# Patient Record
Sex: Male | Born: 1945 | Smoking: Former smoker
Health system: Southern US, Community
[De-identification: ages and names within clinical notes are randomized; demographics above are authoritative.]

## PROBLEM LIST (undated history)

## (undated) DIAGNOSIS — E669 Obesity, unspecified: Secondary | ICD-10-CM

## (undated) DIAGNOSIS — I10 Essential (primary) hypertension: Secondary | ICD-10-CM

## (undated) DIAGNOSIS — K219 Gastro-esophageal reflux disease without esophagitis: Secondary | ICD-10-CM

## (undated) DIAGNOSIS — E1169 Type 2 diabetes mellitus with other specified complication: Secondary | ICD-10-CM

## (undated) DIAGNOSIS — G629 Polyneuropathy, unspecified: Secondary | ICD-10-CM

## (undated) DIAGNOSIS — I2510 Atherosclerotic heart disease of native coronary artery without angina pectoris: Secondary | ICD-10-CM

## (undated) DIAGNOSIS — N186 End stage renal disease: Secondary | ICD-10-CM

## (undated) HISTORY — PX: ANGIOPLASTY: SHX39

---

## 2019-01-06 ENCOUNTER — Inpatient Hospital Stay (HOSPITAL_COMMUNITY): Payer: Medicare Other

## 2019-01-06 ENCOUNTER — Inpatient Hospital Stay (HOSPITAL_COMMUNITY)
Admission: EM | Admit: 2019-01-06 | Discharge: 2019-01-11 | DRG: 441 | Disposition: A | Discharge status: Home Health | Payer: Medicare Other | Source: Other Acute Inpatient Hospital | Attending: Family Medicine | Admitting: Family Medicine

## 2019-01-06 ENCOUNTER — Encounter (HOSPITAL_COMMUNITY): Payer: Self-pay | Admitting: Pulmonary Disease

## 2019-01-06 DIAGNOSIS — E1143 Type 2 diabetes mellitus with diabetic autonomic (poly)neuropathy: Secondary | ICD-10-CM | POA: Diagnosis present

## 2019-01-06 DIAGNOSIS — N186 End stage renal disease: Secondary | ICD-10-CM | POA: Diagnosis present

## 2019-01-06 DIAGNOSIS — D631 Anemia in chronic kidney disease: Secondary | ICD-10-CM | POA: Diagnosis present

## 2019-01-06 DIAGNOSIS — K7200 Acute and subacute hepatic failure without coma: Principal | ICD-10-CM

## 2019-01-06 DIAGNOSIS — I4510 Unspecified right bundle-branch block: Secondary | ICD-10-CM | POA: Diagnosis present

## 2019-01-06 DIAGNOSIS — D689 Coagulation defect, unspecified: Secondary | ICD-10-CM | POA: Diagnosis not present

## 2019-01-06 DIAGNOSIS — I132 Hypertensive heart and chronic kidney disease with heart failure and with stage 5 chronic kidney disease, or end stage renal disease: Secondary | ICD-10-CM | POA: Diagnosis present

## 2019-01-06 DIAGNOSIS — G4733 Obstructive sleep apnea (adult) (pediatric): Secondary | ICD-10-CM | POA: Diagnosis present

## 2019-01-06 DIAGNOSIS — K3184 Gastroparesis: Secondary | ICD-10-CM | POA: Diagnosis present

## 2019-01-06 DIAGNOSIS — E875 Hyperkalemia: Secondary | ICD-10-CM | POA: Diagnosis present

## 2019-01-06 DIAGNOSIS — J449 Chronic obstructive pulmonary disease, unspecified: Secondary | ICD-10-CM | POA: Diagnosis present

## 2019-01-06 DIAGNOSIS — K7290 Hepatic failure, unspecified without coma: Secondary | ICD-10-CM

## 2019-01-06 DIAGNOSIS — R630 Anorexia: Secondary | ICD-10-CM | POA: Diagnosis present

## 2019-01-06 DIAGNOSIS — I5022 Chronic systolic (congestive) heart failure: Secondary | ICD-10-CM | POA: Diagnosis present

## 2019-01-06 DIAGNOSIS — I2510 Atherosclerotic heart disease of native coronary artery without angina pectoris: Secondary | ICD-10-CM | POA: Diagnosis present

## 2019-01-06 DIAGNOSIS — E1151 Type 2 diabetes mellitus with diabetic peripheral angiopathy without gangrene: Secondary | ICD-10-CM | POA: Diagnosis present

## 2019-01-06 DIAGNOSIS — Z79891 Long term (current) use of opiate analgesic: Secondary | ICD-10-CM

## 2019-01-06 DIAGNOSIS — N2581 Secondary hyperparathyroidism of renal origin: Secondary | ICD-10-CM | POA: Diagnosis present

## 2019-01-06 DIAGNOSIS — D696 Thrombocytopenia, unspecified: Secondary | ICD-10-CM | POA: Diagnosis not present

## 2019-01-06 DIAGNOSIS — E1122 Type 2 diabetes mellitus with diabetic chronic kidney disease: Secondary | ICD-10-CM | POA: Diagnosis present

## 2019-01-06 DIAGNOSIS — Z9981 Dependence on supplemental oxygen: Secondary | ICD-10-CM | POA: Diagnosis not present

## 2019-01-06 DIAGNOSIS — D6959 Other secondary thrombocytopenia: Secondary | ICD-10-CM | POA: Diagnosis present

## 2019-01-06 DIAGNOSIS — R579 Shock, unspecified: Secondary | ICD-10-CM | POA: Diagnosis not present

## 2019-01-06 DIAGNOSIS — R578 Other shock: Secondary | ICD-10-CM | POA: Diagnosis present

## 2019-01-06 DIAGNOSIS — Z86010 Personal history of colonic polyps: Secondary | ICD-10-CM

## 2019-01-06 DIAGNOSIS — Z6829 Body mass index (BMI) 29.0-29.9, adult: Secondary | ICD-10-CM

## 2019-01-06 DIAGNOSIS — E785 Hyperlipidemia, unspecified: Secondary | ICD-10-CM | POA: Diagnosis present

## 2019-01-06 DIAGNOSIS — I272 Pulmonary hypertension, unspecified: Secondary | ICD-10-CM | POA: Diagnosis present

## 2019-01-06 DIAGNOSIS — Z992 Dependence on renal dialysis: Secondary | ICD-10-CM | POA: Diagnosis not present

## 2019-01-06 DIAGNOSIS — Z86718 Personal history of other venous thrombosis and embolism: Secondary | ICD-10-CM

## 2019-01-06 DIAGNOSIS — R945 Abnormal results of liver function studies: Secondary | ICD-10-CM | POA: Diagnosis present

## 2019-01-06 DIAGNOSIS — R570 Cardiogenic shock: Secondary | ICD-10-CM | POA: Diagnosis not present

## 2019-01-06 DIAGNOSIS — Z9049 Acquired absence of other specified parts of digestive tract: Secondary | ICD-10-CM

## 2019-01-06 DIAGNOSIS — D684 Acquired coagulation factor deficiency: Secondary | ICD-10-CM | POA: Diagnosis present

## 2019-01-06 DIAGNOSIS — I21A1 Myocardial infarction type 2: Secondary | ICD-10-CM | POA: Diagnosis present

## 2019-01-06 DIAGNOSIS — Z7901 Long term (current) use of anticoagulants: Secondary | ICD-10-CM

## 2019-01-06 DIAGNOSIS — I429 Cardiomyopathy, unspecified: Secondary | ICD-10-CM | POA: Diagnosis present

## 2019-01-06 DIAGNOSIS — I440 Atrioventricular block, first degree: Secondary | ICD-10-CM | POA: Diagnosis present

## 2019-01-06 DIAGNOSIS — K759 Inflammatory liver disease, unspecified: Secondary | ICD-10-CM

## 2019-01-06 DIAGNOSIS — R001 Bradycardia, unspecified: Secondary | ICD-10-CM | POA: Diagnosis present

## 2019-01-06 DIAGNOSIS — I080 Rheumatic disorders of both mitral and aortic valves: Secondary | ICD-10-CM | POA: Diagnosis present

## 2019-01-06 DIAGNOSIS — B179 Acute viral hepatitis, unspecified: Secondary | ICD-10-CM | POA: Diagnosis present

## 2019-01-06 DIAGNOSIS — Z87891 Personal history of nicotine dependence: Secondary | ICD-10-CM

## 2019-01-06 DIAGNOSIS — Z9682 Presence of neurostimulator: Secondary | ICD-10-CM

## 2019-01-06 DIAGNOSIS — Z9862 Peripheral vascular angioplasty status: Secondary | ICD-10-CM

## 2019-01-06 DIAGNOSIS — E119 Type 2 diabetes mellitus without complications: Secondary | ICD-10-CM | POA: Diagnosis not present

## 2019-01-06 DIAGNOSIS — K219 Gastro-esophageal reflux disease without esophagitis: Secondary | ICD-10-CM | POA: Diagnosis present

## 2019-01-06 DIAGNOSIS — R7989 Other specified abnormal findings of blood chemistry: Secondary | ICD-10-CM | POA: Diagnosis present

## 2019-01-06 HISTORY — DX: Polyneuropathy, unspecified: G62.9

## 2019-01-06 HISTORY — DX: Atherosclerotic heart disease of native coronary artery without angina pectoris: I25.10

## 2019-01-06 HISTORY — DX: End stage renal disease: N18.6

## 2019-01-06 HISTORY — DX: Gastro-esophageal reflux disease without esophagitis: K21.9

## 2019-01-06 HISTORY — DX: Type 2 diabetes mellitus with other specified complication: E11.69

## 2019-01-06 HISTORY — DX: Type 2 diabetes mellitus with other specified complication: E66.9

## 2019-01-06 HISTORY — DX: Essential (primary) hypertension: I10

## 2019-01-06 LAB — CBC WITH DIFFERENTIAL/PLATELET
Abs Immature Granulocytes: 0.1 10*3/uL — ABNORMAL HIGH (ref 0.00–0.07)
Basophils Absolute: 0 10*3/uL (ref 0.0–0.1)
Basophils Relative: 0 %
Eosinophils Absolute: 0 10*3/uL (ref 0.0–0.5)
Eosinophils Relative: 0 %
HCT: 25 % — ABNORMAL LOW (ref 39.0–52.0)
Hemoglobin: 8.2 g/dL — ABNORMAL LOW (ref 13.0–17.0)
Immature Granulocytes: 1 %
Lymphocytes Relative: 5 %
Lymphs Abs: 0.8 10*3/uL (ref 0.7–4.0)
MCH: 28.6 pg (ref 26.0–34.0)
MCHC: 32.8 g/dL (ref 30.0–36.0)
MCV: 87.1 fL (ref 80.0–100.0)
Monocytes Absolute: 1.1 10*3/uL — ABNORMAL HIGH (ref 0.1–1.0)
Monocytes Relative: 7 %
Neutro Abs: 12.8 10*3/uL — ABNORMAL HIGH (ref 1.7–7.7)
Neutrophils Relative %: 87 %
Platelets: 167 10*3/uL (ref 150–400)
RBC: 2.87 MIL/uL — ABNORMAL LOW (ref 4.22–5.81)
RDW: 14.4 % (ref 11.5–15.5)
WBC: 14.8 10*3/uL — ABNORMAL HIGH (ref 4.0–10.5)
nRBC: 0.1 % (ref 0.0–0.2)

## 2019-01-06 LAB — COOXEMETRY PANEL
Carboxyhemoglobin: 1.1 % (ref 0.5–1.5)
Methemoglobin: 1.4 % (ref 0.0–1.5)
O2 Saturation: 69.8 %
Total hemoglobin: 8.4 g/dL — ABNORMAL LOW (ref 12.0–16.0)

## 2019-01-06 LAB — GLUCOSE, CAPILLARY
Glucose-Capillary: 160 mg/dL — ABNORMAL HIGH (ref 70–99)
Glucose-Capillary: 150 mg/dL — ABNORMAL HIGH (ref 70–99)
Glucose-Capillary: 197 mg/dL — ABNORMAL HIGH (ref 70–99)

## 2019-01-06 LAB — MAGNESIUM: Magnesium: 2.1 mg/dL (ref 1.7–2.4)

## 2019-01-06 LAB — COMPREHENSIVE METABOLIC PANEL
ALT: 2081 U/L — ABNORMAL HIGH (ref 0–44)
AST: 4014 U/L — ABNORMAL HIGH (ref 15–41)
Albumin: 2.6 g/dL — ABNORMAL LOW (ref 3.5–5.0)
Alkaline Phosphatase: 134 U/L — ABNORMAL HIGH (ref 38–126)
Anion gap: 22 — ABNORMAL HIGH (ref 5–15)
BUN: 59 mg/dL — ABNORMAL HIGH (ref 8–23)
CO2: 17 mmol/L — ABNORMAL LOW (ref 22–32)
Calcium: 7.9 mg/dL — ABNORMAL LOW (ref 8.9–10.3)
Chloride: 95 mmol/L — ABNORMAL LOW (ref 98–111)
Creatinine, Ser: 10.04 mg/dL — ABNORMAL HIGH (ref 0.61–1.24)
GFR calc Af Amer: 5 mL/min — ABNORMAL LOW (ref >60)
GFR calc non Af Amer: 5 mL/min — ABNORMAL LOW (ref >60)
Glucose, Bld: 150 mg/dL — ABNORMAL HIGH (ref 70–99)
Potassium: 6.6 mmol/L (ref 3.5–5.1)
Sodium: 134 mmol/L — ABNORMAL LOW (ref 135–145)
Total Bilirubin: 1.6 mg/dL — ABNORMAL HIGH (ref 0.3–1.2)
Total Protein: 5.9 g/dL — ABNORMAL LOW (ref 6.5–8.1)

## 2019-01-06 LAB — BRAIN NATRIURETIC PEPTIDE: B Natriuretic Peptide: 3496.8 pg/mL — ABNORMAL HIGH (ref 0.0–100.0)

## 2019-01-06 LAB — TYPE AND SCREEN
ABO/RH(D): A POS
Antibody Screen: NEGATIVE

## 2019-01-06 LAB — APTT: aPTT: 45 seconds — ABNORMAL HIGH (ref 24–36)

## 2019-01-06 LAB — PROTIME-INR
INR: 6.3 (ref 0.8–1.2)
Prothrombin Time: 54.7 seconds — ABNORMAL HIGH (ref 11.4–15.2)

## 2019-01-06 LAB — PHOSPHORUS: Phosphorus: 9.6 mg/dL — ABNORMAL HIGH (ref 2.5–4.6)

## 2019-01-06 LAB — LACTIC ACID, PLASMA: Lactic Acid, Venous: 6.6 mmol/L (ref 0.5–1.9)

## 2019-01-06 LAB — PROCALCITONIN: Procalcitonin: 7.39 ng/mL

## 2019-01-06 LAB — MRSA PCR SCREENING: MRSA by PCR: NEGATIVE

## 2019-01-06 LAB — ABO/RH: ABO/RH(D): A POS

## 2019-01-06 LAB — TROPONIN I: Troponin I: 0.26 ng/mL (ref <0.03)

## 2019-01-06 MED ORDER — SODIUM CHLORIDE 0.9 % IV SOLN
2.0000 g | Freq: Once | INTRAVENOUS | Status: AC
  Administered 2019-01-06: 23:00:00 2 g via INTRAVENOUS
  Filled 2019-01-06: qty 2

## 2019-01-06 MED ORDER — IPRATROPIUM-ALBUTEROL 0.5-2.5 (3) MG/3ML IN SOLN
3.0000 mL | Freq: Four times a day (QID) | RESPIRATORY_TRACT | Status: DC
  Administered 2019-01-06 – 2019-01-07 (×2): 3 mL via RESPIRATORY_TRACT
  Filled 2019-01-06 (×2): qty 3

## 2019-01-06 MED ORDER — CALCIUM GLUCONATE-NACL 1-0.675 GM/50ML-% IV SOLN
1.0000 g | Freq: Once | INTRAVENOUS | Status: AC
  Administered 2019-01-06: 22:00:00 1000 mg via INTRAVENOUS
  Filled 2019-01-06: qty 50

## 2019-01-06 MED ORDER — INSULIN ASPART 100 UNIT/ML IV SOLN
10.0000 [IU] | Freq: Once | INTRAVENOUS | Status: AC
  Administered 2019-01-06: 10 [IU] via INTRAVENOUS

## 2019-01-06 MED ORDER — SODIUM BICARBONATE 8.4 % IV SOLN
50.0000 meq | Freq: Once | INTRAVENOUS | Status: AC
  Administered 2019-01-06: 50 meq via INTRAVENOUS
  Filled 2019-01-06: qty 50

## 2019-01-06 MED ORDER — VANCOMYCIN HCL IN DEXTROSE 1-5 GM/200ML-% IV SOLN
1000.0000 mg | Freq: Once | INTRAVENOUS | Status: DC
  Filled 2019-01-06: qty 200

## 2019-01-06 MED ORDER — VASOPRESSIN 20 UNIT/ML IV SOLN
0.0300 [IU]/min | INTRAVENOUS | Status: DC
  Administered 2019-01-06: 0.03 [IU]/min via INTRAVENOUS
  Filled 2019-01-06: qty 2

## 2019-01-06 MED ORDER — VITAMIN K1 10 MG/ML IJ SOLN
10.0000 mg | Freq: Once | INTRAVENOUS | Status: AC
  Administered 2019-01-06: 10 mg via INTRAVENOUS
  Filled 2019-01-06: qty 1

## 2019-01-06 MED ORDER — INSULIN ASPART 100 UNIT/ML ~~LOC~~ SOLN
0.0000 [IU] | SUBCUTANEOUS | Status: DC
  Administered 2019-01-07: 16:00:00 2 [IU] via SUBCUTANEOUS
  Administered 2019-01-07 (×2): 3 [IU] via SUBCUTANEOUS
  Administered 2019-01-07: 5 [IU] via SUBCUTANEOUS
  Administered 2019-01-07: 3 [IU] via SUBCUTANEOUS

## 2019-01-06 MED ORDER — SODIUM CHLORIDE 0.9 % IV SOLN
1.0000 g | INTRAVENOUS | Status: DC
  Administered 2019-01-07: 1 g via INTRAVENOUS
  Filled 2019-01-06 (×2): qty 1

## 2019-01-06 MED ORDER — GLUCAGON HCL RDNA (DIAGNOSTIC) 1 MG IJ SOLR
2.0000 mg/h | INTRAVENOUS | Status: DC
  Administered 2019-01-07 (×2): 3 mg/h via INTRAVENOUS
  Filled 2019-01-06 (×2): qty 15

## 2019-01-06 MED ORDER — DEXTROSE 50 % IV SOLN
50.0000 mL | Freq: Once | INTRAVENOUS | Status: AC
  Administered 2019-01-06: 22:00:00 50 mL via INTRAVENOUS
  Filled 2019-01-06: qty 50

## 2019-01-06 MED ORDER — NOREPINEPHRINE 4 MG/250ML-% IV SOLN
0.0000 ug/min | INTRAVENOUS | Status: DC
  Administered 2019-01-06: 19:00:00 5 ug/min via INTRAVENOUS

## 2019-01-06 MED ORDER — SODIUM CHLORIDE 0.9 % IV SOLN
INTRAVENOUS | Status: DC | PRN
  Administered 2019-01-06: 250 mL via INTRAVENOUS

## 2019-01-06 MED ORDER — VANCOMYCIN HCL 10 G IV SOLR
2000.0000 mg | Freq: Once | INTRAVENOUS | Status: AC
  Administered 2019-01-07: 2000 mg via INTRAVENOUS
  Filled 2019-01-06: qty 2000

## 2019-01-06 MED ORDER — GLUCAGON HCL RDNA (DIAGNOSTIC) 1 MG IJ SOLR
3.0000 mg/h | INTRAVENOUS | Status: AC
  Administered 2019-01-06 (×2): 3 mg/h via INTRAVENOUS
  Filled 2019-01-06 (×3): qty 5

## 2019-01-06 MED ORDER — SODIUM ZIRCONIUM CYCLOSILICATE 10 G PO PACK
20.0000 g | PACK | Freq: Once | ORAL | Status: AC
  Administered 2019-01-06: 22:00:00 20 g via ORAL
  Filled 2019-01-06: qty 2

## 2019-01-06 MED ORDER — SODIUM CHLORIDE 0.9 % IV SOLN: 2.0000 g | Freq: Once | INTRAVENOUS | Status: DC

## 2019-01-06 MED ORDER — BUDESONIDE 0.25 MG/2ML IN SUSP
0.2500 mg | Freq: Two times a day (BID) | RESPIRATORY_TRACT | Status: DC
  Administered 2019-01-06 – 2019-01-10 (×8): 0.25 mg via RESPIRATORY_TRACT
  Filled 2019-01-06 (×10): qty 2

## 2019-01-06 MED ORDER — PANTOPRAZOLE SODIUM 40 MG IV SOLR
40.0000 mg | Freq: Every day | INTRAVENOUS | Status: DC
  Administered 2019-01-06 – 2019-01-08 (×3): 40 mg via INTRAVENOUS
  Filled 2019-01-06 (×2): qty 40

## 2019-01-06 MED ORDER — ARFORMOTEROL TARTRATE 15 MCG/2ML IN NEBU
15.0000 ug | INHALATION_SOLUTION | Freq: Two times a day (BID) | RESPIRATORY_TRACT | Status: DC
  Administered 2019-01-06 – 2019-01-10 (×8): 15 ug via RESPIRATORY_TRACT
  Filled 2019-01-06 (×10): qty 2

## 2019-01-06 MED ORDER — SODIUM ZIRCONIUM CYCLOSILICATE 10 G PO PACK
10.0000 g | PACK | Freq: Once | ORAL | Status: DC
  Filled 2019-01-06: qty 1

## 2019-01-06 MED ORDER — ONDANSETRON HCL 4 MG/2ML IJ SOLN: 4.0000 mg | Freq: Four times a day (QID) | INTRAMUSCULAR | Status: DC | PRN

## 2019-01-06 NOTE — Progress Notes (Signed)
PCCM Interval Note   Admit labs noted, specifically K 6.6, INR 6.3, CO2 17  Last iHD reportedly Thursday on 6/4 at [facility] in [place], full treatment.  Remains SB in the 40's.  Remains on levophed 5 mcg/min and vasopressin.   Blood pressure (!) 117/49, pulse (!) 44, resp. rate 18, height 5\' 10"  (1.778 m), weight 83.6 kg, SpO2 100 %.   P:  - Calcium gluconate 1 gm now, w/ insulin 10 units IV x 1, D50, and amp bicarb with lokemia 10 mg now - repeat BMP in 4 hrs; monitor for ongoing telemetry changes/ bradycardia - Vitamin K 10mg  IV now - unclear if elevated INR from coumadin vs Transaminitis; recheck INR in am.  Hold on Kcentra or FFP for now given no active bleeding and concern for RV failure  - Called Nephrology consult.  Spoke with Dr. Moshe Cipro who will see in the morning.  Advised to give additional dose of lokemia 10 mg.     Kennieth Rad, MSN, AGACNP-BC [facility] Pgr: [phone] or if no answer [phone] 01/06/2019, 9:25 PM

## 2019-01-06 NOTE — Progress Notes (Signed)
Pharmacy Antibiotic Note  Frederick Kline is a 73 y.o. male admitted on 01/06/2019 with shock, suspected cardiogenic but covering for sepsis as well while gathering more data. Pharmacy has been consulted for vancomycin and cefepime dosing. Patient is ESRD on HD TWSa.  Plan: Vancomycin 2g IV x1 - follow up HD schedule for further maintenance doses Cefepime 2g IV x1, followed by 1g IV q24h for now until HD schedule determined F/u clinical status, C&S, HD schedule/tolerance, de-escalation, LOT, vancomycin levels as appropriate  No data recorded.  Recent Labs  Lab 01/06/19 1839  WBC 14.8*  CREATININE 10.04*  LATICACIDVEN 6.6*    CrCl cannot be calculated (Unknown ideal weight.).    Allergies not on file  Antimicrobials this admission: Vancomycin 6/6 >> Cefepime 6/6 >>  Microbiology results: 6/6 MRSA PCR: neg 6/6 BCx: collected 6/6 UCx: sent 6/6 TA cx: sent  Thank you for allowing pharmacy to be a part of this patient's care.  Mila Merry Gerarda Fraction, PharmD, [place] PGY2 Infectious Diseases Pharmacy Resident Phone: [phone] 01/06/2019 8:03 PM

## 2019-01-06 NOTE — H&P (Signed)
For H+P please see progress note date 01/06/19 and signed at 6:43PM by me.

## 2019-01-06 NOTE — Progress Notes (Signed)
RT NOTE: RT attempted ABG x2 unsuccessfully. RN aware. RT will attempt tonight.

## 2019-01-06 NOTE — Progress Notes (Signed)
NAME:  Frederick Kline, MRN:  [id number], DOB:  25-Mar-1946, LOS: 0 ADMISSION DATE:  01/06/2019, CONSULTATION DATE:  01/06/19 REFERRING MD:  [facility] CHIEF COMPLAINT:  N/V   Brief History   73 year old man with multitude of medical problems detailed below presenting with 5 days of N/V to [place] [facility].  Workup there revealed acute hepatitis of unclear origin.  Hospital day 2 noted in HD to be bradycardic and hypotensive.  Given multiple liter saline boluses without response.  Central line placed and called for transfer to [facility] as [facility] was full.  Currently c/o weakness, inability to tolerate of PO TSH 4, A1c 7.8%, CBC looked okay Albumin 2.8, AST 948 (from 1177), ALT 726 (from 630), Alk phos 150, bili 0.5, K 5.1, Mg 2.3  History of present illness   See above  Past Medical History  HTN DM2 ESRD on HD TuWedSat PVD s/p angioplasty Gastroparesis, DM COPD OSA not on CPAP CAD  Significant Hospital Events   01/06/19 Admitted  Consults:  Cardiology 6/6  Procedures:  01/06/19   Significant Diagnostic Tests:  Pending  Micro Data:  Blood cultures 6/6 pending  Antimicrobials:  Vancomycin 01/06/19 - Cefepime 01/06/19 -  Interim history/subjective:  See above  Objective   Blood pressure (!) 115/46, pulse (!) 41, resp. rate 14, SpO2 99 %.       No intake or output data in the 24 hours ending 01/06/19 1839 There were no vitals filed for this visit.  Examination: General: ill appearing man in NAD HENT: +JVD, MMM Lungs: diminished bases, no accessory muscle use Cardiovascular: bradycardic, +systolic murmur, ext lukewarm.  Echo with globally down LV, dilated IVC, calcified appearing mitral valve Abdomen: nontender, no hepatomegaly Extremities: not much edema Neuro: moving all 4 ext, globally weak GU: deferred  Resolved Hospital Problem list   NA  Assessment & Plan:  (1) Shock, suspicion more cardiogenic than distributive.  Question of BB and CCB contributing  to (2).   (2) Inappropriate chronotropic response (3) Acute liver injury (4) Underlying advanced DM, HTN, HLD, COPD  Discussed with cardiology, plan tonight will be to try to temporize with levophed, glucagon gtt in hopes this is just over-reaction to CCB/BB.  If no improvement by AM will probably need to do [facility] and consideration for TVP.  - Echo, troponin, SvO2 - Abd [place] - Blood cultures, procalcitonin, vancomycin, cefepime while getting more data - CXR just done looks pretty clear - No IVF, no hepatotoxic meds - Nebs as ordered - Full code confirmed with patient  Best practice:  Diet: NPO pending abd [place] Pain/Anxiety/Delirium protocol (if indicated): NA VAP protocol (if indicated): NA DVT prophylaxis: on warfarin, check INR, consider resuming in AM GI prophylaxis: PPI Glucose control: SSI Mobility: Bedrest Code Status: full Family Communication: N/A Disposition: ICU pending shock workup  Labs   CBC: No results for input(s): WBC, NEUTROABS, HGB, HCT, MCV, PLT in the last 168 hours.  Basic Metabolic Panel: No results for input(s): NA, K, CL, CO2, GLUCOSE, BUN, CREATININE, CALCIUM, MG, PHOS in the last 168 hours. GFR: CrCl cannot be calculated (No successful lab value found.). No results for input(s): PROCALCITON, WBC, LATICACIDVEN in the last 168 hours.  Liver Function Tests: No results for input(s): AST, ALT, ALKPHOS, BILITOT, PROT, ALBUMIN in the last 168 hours. No results for input(s): LIPASE, AMYLASE in the last 168 hours. No results for input(s): AMMONIA in the last 168 hours.  ABG No results found for: PHART, PCO2ART,  PO2ART, HCO3, TCO2, ACIDBASEDEF, O2SAT   Coagulation Profile: No results for input(s): INR, PROTIME in the last 168 hours.  Cardiac Enzymes: No results for input(s): CKTOTAL, CKMB, CKMBINDEX, TROPONINI in the last 168 hours.  HbA1C: No results found for: HGBA1C  CBG: No results for input(s): GLUCAP in the last 168 hours.  Review of Systems:    + for N/V, weakness, low grade fever/chills, anorexia, mild SOB - for chest pain, rashes, joint aches/pains  Past Medical History  He,  has a past medical history of CAD (coronary artery disease), Diabetes mellitus type 2 in obese ([place]), ESRD (end stage renal disease) ([place]), GERD (gastroesophageal reflux disease), HTN (hypertension), and Neuropathy.   Surgical History   Appendectomy Cholecystectomy Hernia repair PVD with limb-threatening ischemia 12/2018 s/p angioplasty on coumadin  Social History   reports that he has quit smoking. He has a 30.00 pack-year smoking history. He does not have any smokeless tobacco history on file.   Family History   His family history is not on file.   Allergies Allergies not on file   Home Medications  Prior to Admission medications   Not on File     Critical care time:  33 minutes CC time not including any separately billable procedures.

## 2019-01-07 ENCOUNTER — Encounter (HOSPITAL_COMMUNITY): Payer: Self-pay | Admitting: Cardiology

## 2019-01-07 ENCOUNTER — Inpatient Hospital Stay (HOSPITAL_COMMUNITY): Payer: Medicare Other

## 2019-01-07 ENCOUNTER — Other Ambulatory Visit (HOSPITAL_COMMUNITY): Payer: Medicare Other

## 2019-01-07 DIAGNOSIS — E119 Type 2 diabetes mellitus without complications: Secondary | ICD-10-CM

## 2019-01-07 DIAGNOSIS — R570 Cardiogenic shock: Secondary | ICD-10-CM

## 2019-01-07 DIAGNOSIS — R001 Bradycardia, unspecified: Secondary | ICD-10-CM

## 2019-01-07 DIAGNOSIS — K7200 Acute and subacute hepatic failure without coma: Secondary | ICD-10-CM

## 2019-01-07 LAB — BASIC METABOLIC PANEL
Anion gap: 21 — ABNORMAL HIGH (ref 5–15)
Anion gap: 18 — ABNORMAL HIGH (ref 5–15)
BUN: 64 mg/dL — ABNORMAL HIGH (ref 8–23)
BUN: 67 mg/dL — ABNORMAL HIGH (ref 8–23)
CO2: 20 mmol/L — ABNORMAL LOW (ref 22–32)
CO2: 21 mmol/L — ABNORMAL LOW (ref 22–32)
Calcium: 7.9 mg/dL — ABNORMAL LOW (ref 8.9–10.3)
Calcium: 7.8 mg/dL — ABNORMAL LOW (ref 8.9–10.3)
Chloride: 95 mmol/L — ABNORMAL LOW (ref 98–111)
Chloride: 95 mmol/L — ABNORMAL LOW (ref 98–111)
Creatinine, Ser: 10.28 mg/dL — ABNORMAL HIGH (ref 0.61–1.24)
Creatinine, Ser: 10.08 mg/dL — ABNORMAL HIGH (ref 0.61–1.24)
GFR calc Af Amer: 5 mL/min — ABNORMAL LOW (ref >60)
GFR calc Af Amer: 5 mL/min — ABNORMAL LOW (ref >60)
GFR calc non Af Amer: 4 mL/min — ABNORMAL LOW (ref >60)
GFR calc non Af Amer: 4 mL/min — ABNORMAL LOW (ref >60)
Glucose, Bld: 246 mg/dL — ABNORMAL HIGH (ref 70–99)
Glucose, Bld: 281 mg/dL — ABNORMAL HIGH (ref 70–99)
Potassium: 5.3 mmol/L — ABNORMAL HIGH (ref 3.5–5.1)
Potassium: 5.4 mmol/L — ABNORMAL HIGH (ref 3.5–5.1)
Sodium: 136 mmol/L (ref 135–145)
Sodium: 134 mmol/L — ABNORMAL LOW (ref 135–145)

## 2019-01-07 LAB — TROPONIN I
Troponin I: 0.3 ng/mL (ref <0.03)
Troponin I: 0.31 ng/mL (ref <0.03)

## 2019-01-07 LAB — PROTIME-INR
INR: 7.1 (ref 0.8–1.2)
INR: 5.6 (ref 0.8–1.2)
INR: 3 — ABNORMAL HIGH (ref 0.8–1.2)
Prothrombin Time: 59.6 seconds — ABNORMAL HIGH (ref 11.4–15.2)
Prothrombin Time: 50 seconds — ABNORMAL HIGH (ref 11.4–15.2)
Prothrombin Time: 30.7 seconds — ABNORMAL HIGH (ref 11.4–15.2)

## 2019-01-07 LAB — COMPREHENSIVE METABOLIC PANEL
ALT: 3594 U/L — ABNORMAL HIGH (ref 0–44)
AST: 9355 U/L — ABNORMAL HIGH (ref 15–41)
Albumin: 2.5 g/dL — ABNORMAL LOW (ref 3.5–5.0)
Alkaline Phosphatase: 144 U/L — ABNORMAL HIGH (ref 38–126)
Anion gap: 19 — ABNORMAL HIGH (ref 5–15)
BUN: 67 mg/dL — ABNORMAL HIGH (ref 8–23)
CO2: 19 mmol/L — ABNORMAL LOW (ref 22–32)
Calcium: 7.7 mg/dL — ABNORMAL LOW (ref 8.9–10.3)
Chloride: 96 mmol/L — ABNORMAL LOW (ref 98–111)
Creatinine, Ser: 10.34 mg/dL — ABNORMAL HIGH (ref 0.61–1.24)
GFR calc Af Amer: 5 mL/min — ABNORMAL LOW (ref >60)
GFR calc non Af Amer: 4 mL/min — ABNORMAL LOW (ref >60)
Glucose, Bld: 282 mg/dL — ABNORMAL HIGH (ref 70–99)
Potassium: 5.4 mmol/L — ABNORMAL HIGH (ref 3.5–5.1)
Sodium: 134 mmol/L — ABNORMAL LOW (ref 135–145)
Total Bilirubin: 1 mg/dL (ref 0.3–1.2)
Total Protein: 5.3 g/dL — ABNORMAL LOW (ref 6.5–8.1)

## 2019-01-07 LAB — CBC
HCT: 22.1 % — ABNORMAL LOW (ref 39.0–52.0)
Hemoglobin: 7.2 g/dL — ABNORMAL LOW (ref 13.0–17.0)
MCH: 28.5 pg (ref 26.0–34.0)
MCHC: 32.6 g/dL (ref 30.0–36.0)
MCV: 87.4 fL (ref 80.0–100.0)
Platelets: 123 10*3/uL — ABNORMAL LOW (ref 150–400)
RBC: 2.53 MIL/uL — ABNORMAL LOW (ref 4.22–5.81)
RDW: 14.5 % (ref 11.5–15.5)
WBC: 12.8 10*3/uL — ABNORMAL HIGH (ref 4.0–10.5)
nRBC: 0 % (ref 0.0–0.2)

## 2019-01-07 LAB — GLUCOSE, CAPILLARY
Glucose-Capillary: 104 mg/dL — ABNORMAL HIGH (ref 70–99)
Glucose-Capillary: 110 mg/dL — ABNORMAL HIGH (ref 70–99)
Glucose-Capillary: 193 mg/dL — ABNORMAL HIGH (ref 70–99)
Glucose-Capillary: 213 mg/dL — ABNORMAL HIGH (ref 70–99)
Glucose-Capillary: 230 mg/dL — ABNORMAL HIGH (ref 70–99)
Glucose-Capillary: 248 mg/dL — ABNORMAL HIGH (ref 70–99)
Glucose-Capillary: 273 mg/dL — ABNORMAL HIGH (ref 70–99)

## 2019-01-07 LAB — MAGNESIUM: Magnesium: 2 mg/dL (ref 1.7–2.4)

## 2019-01-07 LAB — ACETAMINOPHEN LEVEL: Acetaminophen (Tylenol), Serum: <10 ug/mL — ABNORMAL LOW (ref 10–30)

## 2019-01-07 LAB — PHOSPHORUS: Phosphorus: 9.2 mg/dL — ABNORMAL HIGH (ref 2.5–4.6)

## 2019-01-07 LAB — FERRITIN: Ferritin: >7500 ng/mL — ABNORMAL HIGH (ref 24–336)

## 2019-01-07 MED ORDER — INSULIN DETEMIR 100 UNIT/ML ~~LOC~~ SOLN
10.0000 [IU] | Freq: Two times a day (BID) | SUBCUTANEOUS | Status: DC
  Administered 2019-01-07 (×2): 10 [IU] via SUBCUTANEOUS
  Filled 2019-01-07 (×4): qty 0.1

## 2019-01-07 MED ORDER — SODIUM CHLORIDE 0.9% IV SOLUTION
Freq: Once | INTRAVENOUS | Status: AC
  Administered 2019-01-07: 08:00:00 via INTRAVENOUS

## 2019-01-07 MED ORDER — IPRATROPIUM-ALBUTEROL 0.5-2.5 (3) MG/3ML IN SOLN
3.0000 mL | Freq: Three times a day (TID) | RESPIRATORY_TRACT | Status: DC
  Administered 2019-01-07 – 2019-01-08 (×3): 3 mL via RESPIRATORY_TRACT
  Filled 2019-01-07 (×4): qty 3

## 2019-01-07 MED ORDER — VITAMIN K1 10 MG/ML IJ SOLN
10.0000 mg | Freq: Once | INTRAVENOUS | Status: AC
  Administered 2019-01-07: 10:00:00 10 mg via INTRAVENOUS
  Filled 2019-01-07: qty 1

## 2019-01-07 MED ORDER — NAPHAZOLINE-GLYCERIN 0.012-0.2 % OP SOLN
1.0000 [drp] | Freq: Four times a day (QID) | OPHTHALMIC | Status: DC | PRN
  Administered 2019-01-07: 11:00:00 2 [drp] via OPHTHALMIC
  Filled 2019-01-07: qty 15

## 2019-01-07 MED ORDER — VANCOMYCIN HCL IN DEXTROSE 1-5 GM/200ML-% IV SOLN
1000.0000 mg | INTRAVENOUS | Status: DC
  Filled 2019-01-07: qty 200

## 2019-01-07 NOTE — Consult Note (Signed)
CARDIOLOGY CONSULT NOTE  Patient ID: Le Faulcon MRN: [id number] DOB/AGE: 05-Feb-1946 73 y.o.  Late entry for 01/06/2019  Admit date: 01/06/2019 Referring Physician: [facility]  Reason for Consultation:  Bradycardia  HPI:   73 y.o. Caucasian male  with hypertension, type 2 DM, ESRD on HD, PAD with recent intervention on LLE at [facility], ?h/o CAD, admitted at [facility] ICU. Patient was initially admitted to [facility] with generalized weakness, and lightheadedness. Workup is remarkable for elevated liver enzymes. On arrival to [facility], patient is awake, able to carry on a conversation. His HR is in 40s, with normal blood pressure on norepinephrine. He reportedly was given glucagon at [facility], given his baseline use of BB.CCB (details are limited).  Past Medical History:  Diagnosis Date  . CAD (coronary artery disease)   . Diabetes mellitus type 2 in obese ([place])   . ESRD (end stage renal disease) ([place])   . GERD (gastroesophageal reflux disease)   . HTN (hypertension)   . Neuropathy      Past Surgical History:  Procedure Laterality Date  . ANGIOPLASTY       History reviewed. No pertinent family history.   Social History: Social History   Socioeconomic History  . Marital status: Not on file    Spouse name: Not on file  . Number of children: Not on file  . Years of education: Not on file  . Highest education level: Not on file  Occupational History  . Not on file  Social Needs  . Financial resource strain: Not on file  . Food insecurity:    Worry: Not on file    Inability: Not on file  . Transportation needs:    Medical: Not on file    Non-medical: Not on file  Tobacco Use  . Smoking status: Former Smoker    Packs/day: 1.00    Years: 30.00    Pack years: 30.00  Substance and Sexual Activity  . Alcohol use: Not on file  . Drug use: Not on file  . Sexual activity: Not on file  Lifestyle  . Physical activity:    Days per week: Not on  file    Minutes per session: Not on file  . Stress: Not on file  Relationships  . Social connections:    Talks on phone: Not on file    Gets together: Not on file    Attends religious service: Not on file    Active member of club or organization: Not on file    Attends meetings of clubs or organizations: Not on file    Relationship status: Not on file  . Intimate partner violence:    Fear of current or ex partner: Not on file    Emotionally abused: Not on file    Physically abused: Not on file    Forced sexual activity: Not on file  Other Topics Concern  . Not on file  Social History Narrative  . Not on file     No medications prior to admission.    Review of Systems  Constitution: Positive for malaise/fatigue. Negative for decreased appetite, weight gain and weight loss.  HENT: Negative for congestion.   Eyes: Negative for visual disturbance.  Cardiovascular: Negative for chest pain, dyspnea on exertion, leg swelling, palpitations and syncope.       Non healing wound Left foot toes  Respiratory: Negative for cough.   Endocrine: Negative for cold intolerance.  Hematologic/Lymphatic: Does not bruise/bleed easily.  Skin: Negative for itching and rash.  Musculoskeletal: Negative for myalgias.  Gastrointestinal: Negative for abdominal pain, nausea and vomiting.  Genitourinary: Negative for dysuria.  Neurological: Positive for light-headedness. Negative for dizziness and weakness.  Psychiatric/Behavioral: The patient is not nervous/anxious.   All other systems reviewed and are negative.     Physical Exam: Physical Exam  Constitutional: He is oriented to person, place, and time. He appears well-developed and well-nourished. No distress.  HENT:  Head: Normocephalic and atraumatic.  Eyes: Pupils are equal, round, and reactive to light. Conjunctivae are normal.  Neck: No JVD present.  Cardiovascular: Normal rate and regular rhythm.  Murmur heard.  Systolic murmur is  present with a grade of 3/6. Pulses:      Femoral pulses are 3+ on the right side and 3+ on the left side.      Dorsalis pedis pulses are 0 on the right side and 0 on the left side.       Posterior tibial pulses are 0 on the right side and 0 on the left side.  Left foot nonhealing wounds   Pulmonary/Chest: Effort normal and breath sounds normal. He has no wheezes. He has no rales.  Abdominal: Soft. Bowel sounds are normal. There is no rebound.  Musculoskeletal:        General: No edema.  Lymphadenopathy:    He has no cervical adenopathy.  Neurological: He is alert and oriented to person, place, and time. No cranial nerve deficit.  Skin: Skin is warm and dry.  Psychiatric: He has a normal mood and affect.  Nursing note and vitals reviewed.    Labs:   Lab Results  Component Value Date   WBC 14.8 (H) 01/06/2019   HGB 8.2 (L) 01/06/2019   HCT 25.0 (L) 01/06/2019   MCV 87.1 01/06/2019   PLT 167 01/06/2019    Recent Labs  Lab 01/06/19 1839  NA 134*  K 6.6*  CL 95*  CO2 17*  BUN 59*  CREATININE 10.04*  CALCIUM 7.9*  PROT 5.9*  BILITOT 1.6*  ALKPHOS 134*  ALT 2,081*  AST 4,014*  GLUCOSE 150*    Lipid Panel  No results found for: CHOL, TRIG, HDL, CHOLHDL, VLDL, LDLCALC  BNP (last 3 results) Recent Labs    01/06/19 1839  BNP 3,496.8*    HEMOGLOBIN A1C No results found for: HGBA1C, MPG  Cardiac Panel (last 3 results) Recent Labs    01/06/19 1839  TROPONINI 0.26*    Lab Results  Component Value Date   TROPONINI 0.26 (HH) 01/06/2019     TSH No results for input(s): TSH in the last 8760 hours.    Radiology: Dg Chest Port 1 View  Result Date: 01/06/2019 CLINICAL DATA:  Shortness of breath, liver injury, sepsis EXAM: PORTABLE CHEST 1 VIEW COMPARISON:  None. FINDINGS: Cardiomegaly. Right neck vascular catheter, tip projecting over the mid SVC. There is a small left pleural effusion with associated atelectasis or consolidation. There may be a trace right  pleural effusion. IMPRESSION: 1. There is a small left pleural effusion with associated atelectasis or consolidation. There may be a trace right pleural effusion. 2.  Cardiomegaly. Electronically Signed   By: Eddie Candle M.D.   On: 01/06/2019 18:45    Scheduled Meds: . arformoterol  15 mcg Nebulization BID  . budesonide (PULMICORT) nebulizer solution  0.25 mg Nebulization BID  . insulin aspart  0-9 Units Subcutaneous Q4H  . ipratropium-albuterol  3 mL Nebulization Q6H  . pantoprazole (PROTONIX) IV  40 mg Intravenous QHS   Continuous Infusions: . sodium chloride 250 mL (01/06/19 2151)  . ceFEPime (MAXIPIME) IV    . glucagon (GLUCAGEN) infusion (for beta blocker/calcium channel blocker overdose) 3 mg/hr (01/07/19 0034)  . norepinephrine (LEVOPHED) Adult infusion 5 mcg/min (01/06/19 1903)  . vancomycin 2,000 mg (01/07/19 0020)  . vasopressin (PITRESSIN) infusion - *FOR SHOCK* 0.03 Units/min (01/06/19 1902)   PRN Meds:.sodium chloride  CARDIAC STUDIES:  EKG 01/06/2019: Marked sinus bradycardia 42 bpm RBBB. First degree AV block Nonspecific ST-T changes  Echocardiogram pending:    Assessment & Recommendations:  73 y.o. Caucasian male  with hypertension, type 2 DM, ESRD on HD, PAD with recent intervention on LLE at [facility], ?h/o CAD, admitted at [facility] ICU with elevated lover enzymes bradycardia.  Bradycardia: Sinus bradycardia, first degree AV block, RBBB. No sinus pause/arrest/high grade AV block seen. Multiple metabolic derangements will need to be reversed before we can definitively attribute bradycardia and chronotropic incompetence to hypotension/ shock liver picture. Also, patient reportedly is on BB/CCB at baseline. Agree with glucagon for reversal. Agree with current supportive management.   Circulatory shock: SVO2 nearly 70% does not reflect cardiogenic shock   Nigel Mormon, MD 01/07/2019, 12:43 AM [facility] Pager:  [phone] Office: [phone] If no answer Cell [phone]

## 2019-01-07 NOTE — Progress Notes (Signed)
NAME:  Hutton Pellicane, MRN:  [id number], DOB:  1945-10-16, LOS: 1 ADMISSION DATE:  01/06/2019, CONSULTATION DATE:  01/06/19 REFERRING MD:  [place]:  N/V   Brief History   73 year old man with multitude of medical problems detailed below presenting with 5 days of N/V to [place] center.  Workup there revealed acute hepatitis of unclear origin.  Then developed shock and transferred to [facility] for further care.  Past Medical History  HTN DM2 ESRD on HD TuWedSat PVD s/p angioplasty Gastroparesis, DM COPD OSA not on CPAP CAD  Significant Hospital Events   01/06/19 Admitted  Consults:  Cardiology 6/6  Procedures:  01/06/19   Significant Diagnostic Tests:  Pending  Micro Data:  Blood cultures 6/6 pending  Antimicrobials:  Vancomycin 01/06/19 - Cefepime 01/06/19 -  Interim history/subjective:  He states he is feeling better today.  HR is mildly improved to low 60s, pressures are also improved.  He denies any pain, wants to know when he can have food.  Objective   Blood pressure (!) 113/49, pulse (!) 58, temperature 98.3 F (36.8 C), temperature source Oral, resp. rate 11, height 5\' 10"  (1.778 m), weight 84.3 kg, SpO2 99 %. CVP:  [9 mmHg-12 mmHg] 9 mmHg      Intake/Output Summary (Last 24 hours) at 01/07/2019 0809 Last data filed at 01/07/2019 0600 Gross per 24 hour  Intake 1320.38 ml  Output -  Net 1320.38 ml   Filed Weights   01/06/19 2000 01/07/19 0454  Weight: 83.6 kg 84.3 kg    Examination: General: ill appearing man in NAD HENT: +JVD, MMM Lungs: diminished bases, no accessory muscle use Cardiovascular: bradycardic, +systolic murmur, ext lukewarm Abdomen: nontender, no hepatomegaly Extremities: not much edema Neuro: moving all 4 ext, globally weak but better today GU: deferred Skin: mild jaundice  Resolved Hospital Problem list   NA  Assessment & Plan:  (1) Shock, SvO2 argues against cardiogenic.  Pressor requirements improved today  with supportive care. (2) Inappropriate chronotropic response, on glucagon drip,  (3) Acute liver injury of unclear origin- labs sent at [facility], cannot reach them, will unfortunately have to repeat them here (4) Hyperkalemia tx with calcium and lokemla (5) Underlying advanced DM, HTN, HLD, COPD, PVD, ESRD  - f/u echo and abd [place] - send acute liver failure labs - continue vanc/cefepime for now, can dc if cultures negative - going to try to wean off glucagon drip, not sure its doing much - wean vasopressin and levophed to maintain MAP > 65 - Would prefer albumin if further preload augmentation needed - given his preserved SvO2 and improved HR today, do not think we need to pursue SGC or TVP at this time - nephrology consult for HD today - for his coagulopathy related to liver dysfunction, received 10mg  IV vitamin K last night, will repeat today and add 3 units FFP, watch Hgb, warfarin obviously on hold.  Need for the warfarin is questionable, he claims no stent placed during recent angioplasty in his LE - Trend LFTs, INR - Not a transplant candidate so no need to transfer care at this time - Avoid hepatotoxic meds - Overall just try supportive care and hope with time his liver recovers  Best practice:  Diet: NPO pending abd [place] Pain/Anxiety/Delirium protocol (if indicated): NA VAP protocol (if indicated): NA DVT prophylaxis: SCDs given coagulopathy GI prophylaxis: PPI Glucose control: SSI Mobility: Bedrest Code Status: full, confirmed on admission Family Communication: updated patient at time of  evaluation Disposition: ICU pending shock resolution  Critical care time:  33 minutes CC time not including any separately billable procedures.

## 2019-01-07 NOTE — Progress Notes (Addendum)
Subjective:  Feels much better  HR and BP improved. Lab abnormalities of increased LFT's, INR.  Objective:  Vital Signs in the last 24 hours: Temp:  [97.8 F (36.6 C)-98.5 F (36.9 C)] 98.5 F (36.9 C) (06/07 1100) Pulse Rate:  [38-62] 62 (06/07 1100) Resp:  [11-19] 17 (06/07 1100) BP: (94-148)/(46-100) 127/56 (06/07 1100) SpO2:  [95 %-100 %] 99 % (06/07 1100) Weight:  [83.6 kg-84.3 kg] 84.3 kg (06/07 0454)  Intake/Output from previous day: 06/06 0701 - 06/07 0700 In: 1320.4 [I.V.:305.2; IV Piggyback:1015.2] Out: -   Physical Exam Constitutional: He is oriented to person, place, and time. He appears well-developed and well-nourished. No distress.  HENT:  Head: Normocephalic and atraumatic.  Eyes: Pupils are equal, round, and reactive to light. Conjunctivae are normal.  Neck: No JVD present.  Cardiovascular: Normal rate and regular rhythm.  Murmur heard.  Systolic murmur is present with a grade of 3/6. Pulses:      Femoral pulses are 3+ on the right side and 3+ on the left side.      Dorsalis pedis pulses are 0 on the right side and 0 on the left side.       Posterior tibial pulses are 0 on the right side and 0 on the left side.  Left foot nonhealing wounds   Pulmonary/Chest: Effort normal and breath sounds normal. He has no wheezes. He has no rales.  Abdominal: Soft. Bowel sounds are normal. There is no rebound.  Musculoskeletal:        General: No edema.  Lymphadenopathy:    He has no cervical adenopathy.  Neurological: He is alert and oriented to person, place, and time. No cranial nerve deficit.  Skin: Skin is warm and dry.  Psychiatric: He has a normal mood and affect.  Nursing note and vitals reviewed.  Lab Results: BMP Recent Labs    01/06/19 1839 01/07/19 0028 01/07/19 0454  NA 134* 136 134*  134*  K 6.6* 5.3* 5.4*  5.4*  CL 95* 95* 96*  95*  CO2 17* 20* 19*  21*  GLUCOSE 150* 246* 282*  281*  BUN 59* 64* 67*  67*  CREATININE 10.04* 10.28*  10.34*  10.08*  CALCIUM 7.9* 7.9* 7.7*  7.8*  GFRNONAA 5* 4* 4*  4*  GFRAA 5* 5* 5*  5*    CBC Recent Labs  Lab 01/06/19 1839 01/07/19 0454  WBC 14.8* 12.8*  RBC 2.87* 2.53*  HGB 8.2* 7.2*  HCT 25.0* 22.1*  PLT 167 123*  MCV 87.1 87.4  MCH 28.6 28.5  MCHC 32.8 32.6  RDW 14.4 14.5  LYMPHSABS 0.8  --   MONOABS 1.1*  --   EOSABS 0.0  --   BASOSABS 0.0  --     HEMOGLOBIN A1C No results found for: HGBA1C, MPG  Cardiac Panel (last 3 results) Recent Labs    01/06/19 1839 01/07/19 0028 01/07/19 0454  TROPONINI 0.26* 0.30* 0.31*    BNP (last 3 results) Recent Labs    01/06/19 1839  BNP 3,496.8*     Hepatic Function Panel Recent Labs    01/06/19 1839 01/07/19 0454  PROT 5.9* 5.3*  ALBUMIN 2.6* 2.5*  AST 4,014* 9,355*  ALT 2,081* 3,594*  ALKPHOS 134* 144*  BILITOT 1.6* 1.0   CARDIAC STUDIES:  EKG 01/06/2019: Marked sinus bradycardia 42 bpm RBBB. First degree AV block Nonspecific ST-T changes  Echocardiogram pending:    Assessment & Recommendations:  73 y.o. Caucasian male  with hypertension, type 2  DM, ESRD on HD, PAD with recent intervention on LLE at [facility], ?h/o CAD, admitted at [facility] ICU with elevated lover enzymes bradycardia.  Bradycardia: Sinus bradycardia, first degree AV block, RBBB. No sinus pause/arrest/high grade AV block seen. Multiple metabolic derangements will need to be reversed before we can definitively attribute bradycardia and chronotropic incompetence to hypotension/ shock liver picture. Also, patient reportedly is on BB/CCB at baseline. HR much improved on 01/07/2019 suggesting underlying metabolic etiology. Agree with weaning off glucagon and pressors.  No indication for pacemaker.  Circulatory shock: SVO2 nearly 70% does not reflect cardiogenic shock  Improving.  Acute liver injury: Workup and management as per the primary team. Acute right heart failure is in the differential, but I do no  suspect ACS etiology. Echocardiogram pending  Troponin elevation: Type 2 MI. DO not suspect ACS.  Nigel Mormon, M.D. 01/07/2019, 12:21 PM [facility] Pager: [phone] Office: [phone] If no answer: [phone]

## 2019-01-07 NOTE — Progress Notes (Signed)
CRITICAL VALUE ALERT  Critical Value:  - Lactic Acid 6.6 - INR 6.3 - K+ 6.6 - Troponin 0.26  Date & Time Notified: 6/6 1948  Provider Notified: Elink Provider  Orders Received/Actions taken: Orders received and implemented, with frequently reassessment by RN. Pt. currently resting comfortably in bed.

## 2019-01-07 NOTE — Progress Notes (Signed)
CRITICAL VALUE ALERT  Critical Value:  INR 5.6  Date & Time Notied:  01/07/19  Provider Notified: Tamala Julian  Orders Received/Actions taken: Vitamin K already ordered.

## 2019-01-07 NOTE — Progress Notes (Signed)
CRITICAL VALUE ALERT  Critical Value: INR 7.1  Provider Notified: Elink Provider notified 6/7 @ [phone]  Orders Received/Actions taken: Awaiting orders. VSS, pt. resting comfortably in bed.

## 2019-01-07 NOTE — Consult Note (Addendum)
[facility] Renal Consultation Note  Indication for Consultation:  Management of ESRD/hemodialysis; anemia, hypertension/volume and secondary hyperparathyroidism  HPI: Frederick Kline is a 73 y.o. male with ESRD 2/2 DM type 2 (chronic HD TTS followed [facility]) ho  PAD with recent= ''3  wks ago' intervention on "Right Leg needed  3 angioplasties for circulation  "  per pt  at [facility] "was on coumadin 2/2 PAD", ?h/o CAD, admitted at [facility] hospital ICU. Patient was initially admitted to [facility] with 5 days of N/V ,generalized weakness, and lightheadedness. Workup is remarkable for elevated liver enzymes)  Workup there revealed acute hepatitis of unclear origin.  Hospital day 2 noted in HD to be bradycardic HR  40s'  and hypotensive.  Given multiple liter saline boluses without response.  Central line placed and called for transfer to [facility] as [facility] was full.. Admitted to [facility] ICU on pressors    On admit MCH  = , TSH 4, A1c 7.8%,     BUN  59 cr 10.04  K 6.6  Treated with Calcium gluconate 1 gm now, w/ insulin 10 units IV x 1, D50, and amp bicarb with lokemia 10 mg > 5.3   Albumin 2.8, AST 948 (from 1177), ALT 726 (from 630), Alk phos 150, bili 0.5, K 5.1, Mg 2.3  CXR  01/06/19 =small left pleural effusion with associated atelectasis or consolidation/ trace right pleural effusion.   Today feeling better. Gives ho of HD for 3 years , followed initially by [facility] nephrology was on Peritoneal HD 1.5 years changing to HD using LUA AVF  Without difficulties . Denies missing any HD txs ,Was below his edw last HD Thursday 6/04. Denies any fevers , sob, cp , diarrhea ,cough, and lives  With 65 yo girlfriend who is not sick.He reports rare use of ETOH  Only occasional glass of wine.  Tells me he feels a lot better than he did yesterday.  Potassium is down from last evening, he does not appear to be volume overloaded and pressors are currently off      Past Medical  History:  Diagnosis Date  . CAD (coronary artery disease)   . Diabetes mellitus type 2 in obese ([place])   . ESRD (end stage renal disease) ([place])   . GERD (gastroesophageal reflux disease)   . HTN (hypertension)   . Neuropathy     Past Surgical History:  Procedure Laterality Date  . ANGIOPLASTY       History reviewed. No pertinent family history.    reports that he has quit smoking. He has a 30.00 pack-year smoking history. He does not have any smokeless tobacco history on file. No history on file for alcohol and drug.  Allergies not on file  Prior to Admission medications   Not on File     Results for orders placed or performed during the hospital encounter of 01/06/19 (from the past 48 hour(s))  Glucose, capillary     Status: Abnormal   Collection Time: 01/06/19  5:27 PM  Result Value Ref Range   Glucose-Capillary 160 (H) 70 - 99 mg/dL  MRSA PCR Screening     Status: None   Collection Time: 01/06/19  5:33 PM  Result Value Ref Range   MRSA by PCR NEGATIVE NEGATIVE    Comment:        The GeneXpert MRSA Assay (FDA approved for NASAL specimens only), is one component of a comprehensive MRSA colonization surveillance program.  It is not intended to diagnose MRSA infection nor to guide or monitor treatment for MRSA infections. Performed at [facility], [place] [address], [place], [place] [address]   Culture, blood (routine x 2)     Status: None (Preliminary result)   Collection Time: 01/06/19  6:34 PM  Result Value Ref Range   Specimen Description BLOOD RIGHT HAND    Special Requests AEROBIC BOTTLE ONLY Blood Culture adequate volume    Culture      NO GROWTH < 12 HOURS Performed at [facility] [address], [place], [place] [address]    Report Status PENDING   Comprehensive metabolic panel     Status: Abnormal   Collection Time: 01/06/19  6:39 PM  Result Value Ref Range   Sodium 134 (L) 135 - 145 mmol/L   Potassium 6.6 (HH) 3.5 - 5.1 mmol/L     Comment: NO VISIBLE HEMOLYSIS CRITICAL RESULT CALLED TO, READ BACK BY AND VERIFIED WITH: DAWKINS N,RN 01/06/19 1945 WAYK    Chloride 95 (L) 98 - 111 mmol/L   CO2 17 (L) 22 - 32 mmol/L   Glucose, Bld 150 (H) 70 - 99 mg/dL   BUN 59 (H) 8 - 23 mg/dL   Creatinine, Ser 10.04 (H) 0.61 - 1.24 mg/dL   Calcium 7.9 (L) 8.9 - 10.3 mg/dL   Total Protein 5.9 (L) 6.5 - 8.1 g/dL   Albumin 2.6 (L) 3.5 - 5.0 g/dL   AST 4,014 (H) 15 - 41 U/L    Comment: RESULTS CONFIRMED BY MANUAL DILUTION   ALT 2,081 (H) 0 - 44 U/L   Alkaline Phosphatase 134 (H) 38 - 126 U/L   Total Bilirubin 1.6 (H) 0.3 - 1.2 mg/dL   GFR calc non Af Amer 5 (L) >60 mL/min   GFR calc Af Amer 5 (L) >60 mL/min   Anion gap 22 (H) 5 - 15    Comment: Performed at [facility], [place] [address], [place], [place] [address]  Magnesium     Status: None   Collection Time: 01/06/19  6:39 PM  Result Value Ref Range   Magnesium 2.1 1.7 - 2.4 mg/dL    Comment: Performed at [facility], [place] [address], [place], [place] [address]  Phosphorus     Status: Abnormal   Collection Time: 01/06/19  6:39 PM  Result Value Ref Range   Phosphorus 9.6 (H) 2.5 - 4.6 mg/dL    Comment: Performed at [facility] [address] [place], [place] [address]  Lactic acid, plasma     Status: Abnormal   Collection Time: 01/06/19  6:39 PM  Result Value Ref Range   Lactic Acid, Venous 6.6 (HH) 0.5 - 1.9 mmol/L    Comment: CRITICAL RESULT CALLED TO, READ BACK BY AND VERIFIED WITH: NATE DAWKINS RN AT 1926 01/06/2019 BY Karie Chimera Performed at [facility], [place] [address], [place], [place] [address]   Procalcitonin     Status: None   Collection Time: 01/06/19  6:39 PM  Result Value Ref Range   Procalcitonin 7.39 ng/mL    Comment:        Interpretation: PCT > 2 ng/mL: Systemic infection (sepsis) is likely, unless other causes are known. (NOTE)       Sepsis PCT Algorithm           Lower Respiratory Tract  Infection PCT Algorithm    ----------------------------     ----------------------------         PCT < 0.25 ng/mL                PCT < 0.10 ng/mL         Strongly encourage             Strongly discourage   discontinuation of antibiotics    initiation of antibiotics    ----------------------------     -----------------------------       PCT 0.25 - 0.50 ng/mL            PCT 0.10 - 0.25 ng/mL               OR       >80% decrease in PCT            Discourage initiation of                                            antibiotics      Encourage discontinuation           of antibiotics    ----------------------------     -----------------------------         PCT >= 0.50 ng/mL              PCT 0.26 - 0.50 ng/mL               AND       <80% decrease in PCT              Encourage initiation of                                             antibiotics       Encourage continuation           of antibiotics    ----------------------------     -----------------------------        PCT >= 0.50 ng/mL                  PCT > 0.50 ng/mL               AND         increase in PCT                  Strongly encourage                                      initiation of antibiotics    Strongly encourage escalation           of antibiotics                                     -----------------------------                                           PCT <= 0.25 ng/mL  OR                                        > 80% decrease in PCT                                     Discontinue / Do not initiate                                             antibiotics Performed at [facility], [place] [address], [place], [place] [address]   Brain natriuretic peptide     Status: Abnormal   Collection Time: 01/06/19  6:39 PM  Result Value Ref Range   B Natriuretic Peptide 3,496.8 (H) 0.0 - 100.0 pg/mL    Comment: Performed at [facility] [address], [place], [place]  [address]  CBC WITH DIFFERENTIAL     Status: Abnormal   Collection Time: 01/06/19  6:39 PM  Result Value Ref Range   WBC 14.8 (H) 4.0 - 10.5 K/uL   RBC 2.87 (L) 4.22 - 5.81 MIL/uL   Hemoglobin 8.2 (L) 13.0 - 17.0 g/dL   HCT 25.0 (L) 39.0 - 52.0 %   MCV 87.1 80.0 - 100.0 fL   MCH 28.6 26.0 - 34.0 pg   MCHC 32.8 30.0 - 36.0 g/dL   RDW 14.4 11.5 - 15.5 %   Platelets 167 150 - 400 K/uL   nRBC 0.1 0.0 - 0.2 %   Neutrophils Relative % 87 %   Neutro Abs 12.8 (H) 1.7 - 7.7 K/uL   Lymphocytes Relative 5 %   Lymphs Abs 0.8 0.7 - 4.0 K/uL   Monocytes Relative 7 %   Monocytes Absolute 1.1 (H) 0.1 - 1.0 K/uL   Eosinophils Relative 0 %   Eosinophils Absolute 0.0 0.0 - 0.5 K/uL   Basophils Relative 0 %   Basophils Absolute 0.0 0.0 - 0.1 K/uL   Immature Granulocytes 1 %   Abs Immature Granulocytes 0.10 (H) 0.00 - 0.07 K/uL    Comment: Performed at [facility], [address] [place], [place] [address]  Protime-INR     Status: Abnormal   Collection Time: 01/06/19  6:39 PM  Result Value Ref Range   Prothrombin Time 54.7 (H) 11.4 - 15.2 seconds   INR 6.3 (HH) 0.8 - 1.2    Comment: REPEATED TO VERIFY CRITICAL RESULT CALLED TO, READ BACK BY AND VERIFIED WITH: N DAWSON,RN AT 1936 01/06/2019 BY L BENFIELD (NOTE) INR goal varies based on device and disease states. Performed at [facility], [place] [address], [place], [place] [address]   APTT     Status: Abnormal   Collection Time: 01/06/19  6:39 PM  Result Value Ref Range   aPTT 45 (H) 24 - 36 seconds    Comment:        IF BASELINE aPTT IS ELEVATED, SUGGEST PATIENT RISK ASSESSMENT BE USED TO DETERMINE APPROPRIATE ANTICOAGULANT THERAPY. Performed at [facility],  [address] [place], [place] [address]   Type and screen If need to transfuse blood products please use the blood administration order set     Status: None  Collection Time: 01/06/19  6:39 PM  Result Value Ref Range   ABO/RH(D) A POS    Antibody Screen NEG     Sample Expiration      01/09/2019,2359 Performed at [facility], [place] [address], [place], [place] [address]   Troponin I - Now Then Q6H     Status: Abnormal   Collection Time: 01/06/19  6:39 PM  Result Value Ref Range   Troponin I 0.26 (HH) <0.03 ng/mL    Comment: CRITICAL RESULT CALLED TO, READ BACK BY AND VERIFIED WITH: DAWKINS N,RN 01/06/19 1945 WAYK Performed at [facility], [place] [address] [place], [place] [address]   ABO/Rh     Status: None   Collection Time: 01/06/19  6:39 PM  Result Value Ref Range   ABO/RH(D)      A POS Performed at [facility] [address], [place], [place] [address]   .Cooxemetry Panel (carboxy, met, total hgb, O2 sat)     Status: Abnormal   Collection Time: 01/06/19  6:50 PM  Result Value Ref Range   Total hemoglobin 8.4 (L) 12.0 - 16.0 g/dL   O2 Saturation 69.8 %   Carboxyhemoglobin 1.1 0.5 - 1.5 %   Methemoglobin 1.4 0.0 - 1.5 %  Culture, blood (routine x 2)     Status: None (Preliminary result)   Collection Time: 01/06/19  6:53 PM  Result Value Ref Range   Specimen Description BLOOD RIGHT HAND    Special Requests      AEROBIC BOTTLE ONLY Blood Culture results may not be optimal due to an inadequate volume of blood received in culture bottles   Culture      NO GROWTH < 12 HOURS Performed at [facility] [address], [place], [place] [address]    Report Status PENDING   Glucose, capillary     Status: Abnormal   Collection Time: 01/06/19  8:14 PM  Result Value Ref Range   Glucose-Capillary 150 (H) 70 - 99 mg/dL  Glucose, capillary     Status: Abnormal   Collection Time: 01/06/19 11:25 PM  Result Value Ref Range   Glucose-Capillary 197 (H) 70 - 99 mg/dL  Glucose, capillary     Status: Abnormal   Collection Time: 01/07/19 12:27 AM  Result Value Ref Range   Glucose-Capillary 230 (H) 70 - 99 mg/dL  Troponin I - Now Then Q6H     Status: Abnormal   Collection Time: 01/07/19 12:28 AM  Result Value Ref Range    Troponin I 0.30 (HH) <0.03 ng/mL    Comment: CRITICAL VALUE NOTED.  VALUE IS CONSISTENT WITH PREVIOUSLY REPORTED AND CALLED VALUE. Performed at [facility], [place] [address] [place], [place] [address]   Basic metabolic panel     Status: Abnormal   Collection Time: 01/07/19 12:28 AM  Result Value Ref Range   Sodium 136 135 - 145 mmol/L   Potassium 5.3 (H) 3.5 - 5.1 mmol/L   Chloride 95 (L) 98 - 111 mmol/L   CO2 20 (L) 22 - 32 mmol/L   Glucose, Bld 246 (H) 70 - 99 mg/dL   BUN 64 (H) 8 - 23 mg/dL   Creatinine, Ser 10.28 (H) 0.61 - 1.24 mg/dL   Calcium 7.9 (L) 8.9 - 10.3 mg/dL   GFR calc non Af Amer 4 (L) >60 mL/min   GFR calc Af Amer 5 (L) >60 mL/min   Anion gap 21 (H) 5 - 15  Comment: Performed at [facility], [place] [address] [place], [place] [address]  Glucose, capillary     Status: Abnormal   Collection Time: 01/07/19  4:28 AM  Result Value Ref Range   Glucose-Capillary 248 (H) 70 - 99 mg/dL  CBC     Status: Abnormal   Collection Time: 01/07/19  4:54 AM  Result Value Ref Range   WBC 12.8 (H) 4.0 - 10.5 K/uL   RBC 2.53 (L) 4.22 - 5.81 MIL/uL   Hemoglobin 7.2 (L) 13.0 - 17.0 g/dL   HCT 22.1 (L) 39.0 - 52.0 %   MCV 87.4 80.0 - 100.0 fL   MCH 28.5 26.0 - 34.0 pg   MCHC 32.6 30.0 - 36.0 g/dL   RDW 14.5 11.5 - 15.5 %   Platelets 123 (L) 150 - 400 K/uL   nRBC 0.0 0.0 - 0.2 %    Comment: Performed at [facility], [place] [address] [place], [place] [address]  Basic metabolic panel     Status: Abnormal   Collection Time: 01/07/19  4:54 AM  Result Value Ref Range   Sodium 134 (L) 135 - 145 mmol/L   Potassium 5.4 (H) 3.5 - 5.1 mmol/L   Chloride 95 (L) 98 - 111 mmol/L   CO2 21 (L) 22 - 32 mmol/L   Glucose, Bld 281 (H) 70 - 99 mg/dL   BUN 67 (H) 8 - 23 mg/dL   Creatinine, Ser 10.08 (H) 0.61 - 1.24 mg/dL   Calcium 7.8 (L) 8.9 - 10.3 mg/dL   GFR calc non Af Amer 4 (L) >60 mL/min   GFR calc Af Amer 5 (L) >60 mL/min   Anion gap 18 (H) 5 - 15    Comment: Performed  at [facility] [address], [place], [place] [address]  Magnesium     Status: None   Collection Time: 01/07/19  4:54 AM  Result Value Ref Range   Magnesium 2.0 1.7 - 2.4 mg/dL    Comment: Performed at [facility] [address] [place], [place] [address]  Phosphorus     Status: Abnormal   Collection Time: 01/07/19  4:54 AM  Result Value Ref Range   Phosphorus 9.2 (H) 2.5 - 4.6 mg/dL    Comment: Performed at [facility] [address] [place], [place] [address]  Troponin I - Now Then Q6H     Status: Abnormal   Collection Time: 01/07/19  4:54 AM  Result Value Ref Range   Troponin I 0.31 (HH) <0.03 ng/mL    Comment: CRITICAL VALUE NOTED.  VALUE IS CONSISTENT WITH PREVIOUSLY REPORTED AND CALLED VALUE. Performed at [facility], [place] [address], [place], [place] [address]   Protime-INR     Status: Abnormal   Collection Time: 01/07/19  4:54 AM  Result Value Ref Range   Prothrombin Time 59.6 (H) 11.4 - 15.2 seconds   INR 7.1 (HH) 0.8 - 1.2    Comment: REPEATED TO VERIFY CRITICAL RESULT CALLED TO, READ BACK BY AND VERIFIED WITH: DAWKINS N AT 5784 ON 01/07/2019 BY SAINVILUS S (NOTE) INR goal varies based on device and disease states. Performed at [facility], [place] [address], [place], [place] [address]   Comprehensive metabolic panel     Status: Abnormal   Collection Time: 01/07/19  4:54 AM  Result Value Ref Range   Sodium 134 (L) 135 - 145 mmol/L   Potassium 5.4 (H) 3.5 - 5.1  mmol/L   Chloride 96 (L) 98 - 111 mmol/L   CO2 19 (L) 22 - 32 mmol/L   Glucose, Bld 282 (H) 70 - 99 mg/dL   BUN 67 (H) 8 - 23 mg/dL   Creatinine, Ser 10.34 (H) 0.61 - 1.24 mg/dL   Calcium 7.7 (L) 8.9 - 10.3 mg/dL   Total Protein 5.3 (L) 6.5 - 8.1 g/dL   Albumin 2.5 (L) 3.5 - 5.0 g/dL   AST 9,355 (H) 15 - 41 U/L    Comment: RESULTS CONFIRMED BY MANUAL DILUTION   ALT 3,594 (H) 0 - 44 U/L    Comment: RESULTS CONFIRMED BY MANUAL DILUTION   Alkaline Phosphatase 144 (H) 38  - 126 U/L   Total Bilirubin 1.0 0.3 - 1.2 mg/dL   GFR calc non Af Amer 4 (L) >60 mL/min   GFR calc Af Amer 5 (L) >60 mL/min   Anion gap 19 (H) 5 - 15    Comment: Performed at [facility], [address], [place], [place] [address]  Prepare fresh frozen plasma     Status: None (Preliminary result)   Collection Time: 01/07/19  7:55 AM  Result Value Ref Range   Unit Number [id number]    Blood Component Type THAWED PLASMA    Unit division 00    Status of Unit ISSUED    Transfusion Status OK TO TRANSFUSE    Unit Number [id number]    Blood Component Type THAWED PLASMA    Unit division 00    Status of Unit ISSUED    Transfusion Status      OK TO TRANSFUSE Performed at [facility] [address] [place], [place] [address]    Unit Number [id number]    Blood Component Type THAWED PLASMA    Unit division 00    Status of Unit ALLOCATED    Transfusion Status OK TO TRANSFUSE   Glucose, capillary     Status: Abnormal   Collection Time: 01/07/19  8:04 AM  Result Value Ref Range   Glucose-Capillary 273 (H) 70 - 99 mg/dL  Acetaminophen level     Status: Abnormal   Collection Time: 01/07/19  8:20 AM  Result Value Ref Range   Acetaminophen (Tylenol), Serum <10 (L) 10 - 30 ug/mL    Comment: (NOTE) Therapeutic concentrations vary significantly. A range of 10-30 ug/mL  may be an effective concentration for many patients. However, some  are best treated at concentrations outside of this range. Acetaminophen concentrations >150 ug/mL at 4 hours after ingestion  and >50 ug/mL at 12 hours after ingestion are often associated with  toxic reactions. Performed at [facility], [place] [address] [place],  [address]   Protime-INR     Status: Abnormal (Preliminary result)   Collection Time: 01/07/19  8:22 AM  Result Value Ref Range   Prothrombin Time PENDING 11.4 - 15.2 seconds   INR 5.6 (HH) 0.8 - 1.2    Comment: REPEATED TO VERIFY CRITICAL RESULT CALLED TO,  READ BACK BY AND VERIFIED WITH: H BOWMAN,RN AT 0388 01/07/2019 BY L BENFIELD (NOTE) INR goal varies based on device and disease states. Performed at [facility], [place] [address], [place], [place] [address]     ROS: see hpi  Physical Exam: Vitals:   01/07/19 1000 01/07/19 1039  BP: (!) 125/59 (!) 126/51  Pulse: 61 60  Resp: 18 16  Temp:  97.8 F (36.6 C)  SpO2: 98% 98%  General: alert elderly Male NAD, OX3 , appropriate  HEENT: [place] , EOMI, MM dry , not icteric  Neck: supple , R IJ with central line iv , no jvd  Heart: RRR 2/6 sem , no rub or gallop  Lungs: CTA  Ant , unlabored breathing on 2 l [place] O2 Abdomen: BS pos , soft , NT, ND  Extremities: no pedal edema  Skin: L foot with 1st and 3rd toe tips dry ischemic appearing ulcers  Neuro: alert OX3, moves all extrem . No asterixis  Dialysis Access:LUA AVF  Pos. bruit  And thrill   Dialysis Orders: Center: [facility]   on TTS . EDW :"185 pounds " HD Bath  Time 4hr Heparin none . Access LUA AVF       ( need to call op unit  When open in AM for info)  Assessment/Plan 1. ESRD -   No need for HD today. Lab and volume stable today /  Plan for am HD currently plan for no uf-we will actually do a little 2. Hyperkalemia- treated with meds and now 5.4  Hd plans tomor  Renal/carb mod diet -2K bath 3. Shock, - per admit team =SvO2 argues against cardiogenic.  Pressor requirements improved today with supportive care. 4.  Inappropriate chronotropic response- per admit team  on glucagon drip,  5. Elevated INR  With liver injury - RX per Admit  Use no hep hd ( was on Coumadin for PAD) 6.  Acute liver injury of unclear origin= wu per admitteam  7. HO PAD recent R LE procedure / l foot ulcers  8. Anemia  -  hgb 7.2 , May need transfusion on hd tomor / check with op kid center for ESA use  9. Metabolic bone disease -  Fu ca/phos / ck op unit for hd vit d use  No binders listed , thinks he was on Renvela as binder ,will not start yet    Ernest Haber, PA-C [place] [phone] 01/07/2019, 10:50 AM   Patient seen and examined, agree with above note with above modifications.  73 year old white male veteran and dialysis patient.  Complicated course last 24 hours.  Elevated LFTs, then acute hemodynamic instability with hyperkalemia and bradycardia.  All of this has actually improved with medical therapy-off pressors and feeling much better-LFTs still abnormal.  Receiving medications to reverse an INR.  I agree does not need dialysis acutely today on Sunday which is reserved for emergencies, will plan for routine dialysis in the morning and he should be able to go up to the dialysis unit Corliss Parish, MD 01/07/2019

## 2019-01-08 ENCOUNTER — Inpatient Hospital Stay (HOSPITAL_COMMUNITY): Payer: Medicare Other

## 2019-01-08 LAB — GLUCOSE, CAPILLARY
Glucose-Capillary: 66 mg/dL — ABNORMAL LOW (ref 70–99)
Glucose-Capillary: 87 mg/dL (ref 70–99)
Glucose-Capillary: 60 mg/dL — ABNORMAL LOW (ref 70–99)
Glucose-Capillary: 88 mg/dL (ref 70–99)
Glucose-Capillary: 116 mg/dL — ABNORMAL HIGH (ref 70–99)
Glucose-Capillary: 126 mg/dL — ABNORMAL HIGH (ref 70–99)
Glucose-Capillary: 130 mg/dL — ABNORMAL HIGH (ref 70–99)
Glucose-Capillary: 143 mg/dL — ABNORMAL HIGH (ref 70–99)

## 2019-01-08 LAB — HEPATITIS PANEL, ACUTE
HCV Ab: <0.1 s/co ratio (ref 0.0–0.9)
Hep A IgM: NEGATIVE
Hep B C IgM: NEGATIVE
Hepatitis B Surface Ag: NEGATIVE

## 2019-01-08 LAB — PREPARE FRESH FROZEN PLASMA
Unit division: 0
Unit division: 0
Unit division: 0

## 2019-01-08 LAB — COMPREHENSIVE METABOLIC PANEL
ALT: 2406 U/L — ABNORMAL HIGH (ref 0–44)
AST: 2855 U/L — ABNORMAL HIGH (ref 15–41)
Albumin: 2.5 g/dL — ABNORMAL LOW (ref 3.5–5.0)
Alkaline Phosphatase: 143 U/L — ABNORMAL HIGH (ref 38–126)
Anion gap: 17 — ABNORMAL HIGH (ref 5–15)
BUN: 77 mg/dL — ABNORMAL HIGH (ref 8–23)
CO2: 23 mmol/L (ref 22–32)
Calcium: 7.9 mg/dL — ABNORMAL LOW (ref 8.9–10.3)
Chloride: 96 mmol/L — ABNORMAL LOW (ref 98–111)
Creatinine, Ser: 11.5 mg/dL — ABNORMAL HIGH (ref 0.61–1.24)
GFR calc Af Amer: 4 mL/min — ABNORMAL LOW (ref >60)
GFR calc non Af Amer: 4 mL/min — ABNORMAL LOW (ref >60)
Glucose, Bld: 102 mg/dL — ABNORMAL HIGH (ref 70–99)
Potassium: 4.1 mmol/L (ref 3.5–5.1)
Sodium: 136 mmol/L (ref 135–145)
Total Bilirubin: 1.2 mg/dL (ref 0.3–1.2)
Total Protein: 5.4 g/dL — ABNORMAL LOW (ref 6.5–8.1)

## 2019-01-08 LAB — BPAM FFP
Blood Product Expiration Date: 202006102359
Blood Product Expiration Date: 202006102359
Blood Product Expiration Date: 202006102359
ISSUE DATE / TIME: 202006070820
ISSUE DATE / TIME: 202006071018
ISSUE DATE / TIME: 202006071301
Unit Type and Rh: 6200
Unit Type and Rh: 6200
Unit Type and Rh: 6200

## 2019-01-08 LAB — CBC
HCT: 21.1 % — ABNORMAL LOW (ref 39.0–52.0)
Hemoglobin: 7 g/dL — ABNORMAL LOW (ref 13.0–17.0)
MCH: 28.7 pg (ref 26.0–34.0)
MCHC: 33.2 g/dL (ref 30.0–36.0)
MCV: 86.5 fL (ref 80.0–100.0)
Platelets: 98 10*3/uL — ABNORMAL LOW (ref 150–400)
RBC: 2.44 MIL/uL — ABNORMAL LOW (ref 4.22–5.81)
RDW: 14.5 % (ref 11.5–15.5)
WBC: 9.5 10*3/uL (ref 4.0–10.5)
nRBC: 0.4 % — ABNORMAL HIGH (ref 0.0–0.2)

## 2019-01-08 LAB — ANA W/REFLEX IF POSITIVE: Anti Nuclear Antibody (ANA): NEGATIVE

## 2019-01-08 LAB — ECHOCARDIOGRAM COMPLETE
Height: 70 in
Weight: 3054.69 oz

## 2019-01-08 LAB — PROTIME-INR
INR: 2.9 — ABNORMAL HIGH (ref 0.8–1.2)
INR: 2.6 — ABNORMAL HIGH (ref 0.8–1.2)
Prothrombin Time: 30.1 seconds — ABNORMAL HIGH (ref 11.4–15.2)
Prothrombin Time: 27.5 seconds — ABNORMAL HIGH (ref 11.4–15.2)

## 2019-01-08 MED ORDER — ASPIRIN 325 MG PO TABS
325.0000 mg | ORAL_TABLET | Freq: Every day | ORAL | Status: DC
  Administered 2019-01-08 – 2019-01-11 (×4): 325 mg via ORAL
  Filled 2019-01-08 (×4): qty 1

## 2019-01-08 MED ORDER — LORATADINE 10 MG PO TABS
10.0000 mg | ORAL_TABLET | Freq: Every day | ORAL | Status: DC
  Administered 2019-01-08 – 2019-01-11 (×4): 10 mg via ORAL
  Filled 2019-01-08 (×4): qty 1

## 2019-01-08 MED ORDER — SEVELAMER CARBONATE 800 MG PO TABS
2400.0000 mg | ORAL_TABLET | Freq: Three times a day (TID) | ORAL | Status: DC
  Administered 2019-01-09 – 2019-01-11 (×7): 2400 mg via ORAL
  Filled 2019-01-08 (×8): qty 3

## 2019-01-08 MED ORDER — IPRATROPIUM-ALBUTEROL 0.5-2.5 (3) MG/3ML IN SOLN
3.0000 mL | Freq: Two times a day (BID) | RESPIRATORY_TRACT | Status: DC
  Administered 2019-01-08 – 2019-01-10 (×4): 3 mL via RESPIRATORY_TRACT
  Filled 2019-01-08 (×6): qty 3

## 2019-01-08 MED ORDER — MIRTAZAPINE 15 MG PO TABS
7.5000 mg | ORAL_TABLET | Freq: Every day | ORAL | Status: DC
  Administered 2019-01-08 – 2019-01-10 (×3): 7.5 mg via ORAL
  Filled 2019-01-08 (×3): qty 1

## 2019-01-08 MED ORDER — INSULIN DETEMIR 100 UNIT/ML ~~LOC~~ SOLN
5.0000 [IU] | Freq: Every day | SUBCUTANEOUS | Status: DC
  Filled 2019-01-08: qty 0.05

## 2019-01-08 MED ORDER — INSULIN ASPART 100 UNIT/ML ~~LOC~~ SOLN
0.0000 [IU] | Freq: Three times a day (TID) | SUBCUTANEOUS | Status: DC
  Administered 2019-01-08: 1 [IU] via SUBCUTANEOUS
  Administered 2019-01-09: 2 [IU] via SUBCUTANEOUS

## 2019-01-08 MED ORDER — TRAMADOL HCL 50 MG PO TABS
25.0000 mg | ORAL_TABLET | Freq: Once | ORAL | Status: AC
  Administered 2019-01-08: 25 mg via ORAL
  Filled 2019-01-08: qty 1

## 2019-01-08 MED ORDER — INSULIN ASPART 100 UNIT/ML ~~LOC~~ SOLN: 0.0000 [IU] | SUBCUTANEOUS | Status: DC

## 2019-01-08 MED ORDER — INSULIN DETEMIR 100 UNIT/ML ~~LOC~~ SOLN: 5.0000 [IU] | Freq: Two times a day (BID) | SUBCUTANEOUS | Status: DC

## 2019-01-08 MED ORDER — TRAMADOL HCL 50 MG PO TABS
25.0000 mg | ORAL_TABLET | Freq: Four times a day (QID) | ORAL | Status: DC | PRN
  Administered 2019-01-08 – 2019-01-10 (×2): 25 mg via ORAL
  Filled 2019-01-08 (×2): qty 1

## 2019-01-08 MED ORDER — INSULIN ASPART 100 UNIT/ML ~~LOC~~ SOLN: 0.0000 [IU] | Freq: Three times a day (TID) | SUBCUTANEOUS | Status: DC

## 2019-01-08 NOTE — Progress Notes (Signed)
  Echocardiogram 2D Echocardiogram has been performed.  Jennette Dubin 01/08/2019, 10:32 AM

## 2019-01-08 NOTE — Progress Notes (Signed)
Report given to 5MW RN

## 2019-01-08 NOTE — Progress Notes (Signed)
Report given to HD RN 

## 2019-01-08 NOTE — Evaluation (Signed)
Physical Therapy Evaluation Patient Details Name: Frederick Kline MRN: [id number] DOB: 09-12-1945 Today's Date: 01/08/2019   History of Present Illness  73 year old man with multitude of medical problems detailed below presenting with 5 days of N/V to [facility] medical center.  Workup there revealed acute hepatitis of unclear origin.  Then developed shock and transferred to [facility] for further care.  PMH: HTn, DM, ESRD, COPD  Clinical Impression  Pt admitted with above diagnosis. Pt currently with functional limitations due to the deficits listed below (see PT Problem List). Pt was able to ambulate with RW with good stability.  Encouraged to use RW at home at all times and pt agrees.  HHPT for safety eval recommended. Will follow acutetly.  Pt will benefit from skilled PT to increase their independence and safety with mobility to allow discharge to the venue listed below.      Follow Up Recommendations Home health PT;Supervision/Assistance - 24 hour    Equipment Recommendations  None recommended by PT    Recommendations for Other Services       Precautions / Restrictions Precautions Precautions: None Restrictions Weight Bearing Restrictions: No      Mobility  Bed Mobility Overal bed mobility: Independent                Transfers Overall transfer level: Independent                  Ambulation/Gait Ambulation/Gait assistance: Supervision Gait Distance (Feet): 280 Feet Assistive device: Rolling walker (2 wheeled) Gait Pattern/deviations: Step-through pattern;Decreased stride length   Gait velocity interpretation: 1.31 - 2.62 ft/sec, indicative of limited community ambulator General Gait Details: No issues with gait even with challenges.  Should progress well.  Encouraged to use rollator initially at home for safety and pt agrees.   Stairs            Wheelchair Mobility    Modified Rankin (Stroke Patients Only)       Balance Overall balance assessment:  Needs assistance Sitting-balance support: No upper extremity supported;Feet supported Sitting balance-Leahy Scale: Fair     Standing balance support: No upper extremity supported;During functional activity Standing balance-Leahy Scale: Fair Standing balance comment: can stand statically wihtout support                             Pertinent Vitals/Pain Pain Assessment: No/denies pain    Home Living Family/patient expects to be discharged to:: Private residence Living Arrangements: Spouse/significant other(Best friend who is 26 years old per pt) Available Help at Discharge: Friend(s);Available 24 hours/day Type of Home: House Home Access: Stairs to enter Entrance Stairs-Rails: Can reach both;Right;Left Entrance Stairs-Number of Steps: 4 Home Layout: One level Home Equipment: Walker - 4 wheels;Cane - single point(lift chair)      Prior Function Level of Independence: Independent with assistive device(s)         Comments: used cane at times per pt.  Has used rolattor as well.  Sponge bahting recently due to sickness     Hand Dominance        Extremity/Trunk Assessment   Upper Extremity Assessment Upper Extremity Assessment: Defer to OT evaluation    Lower Extremity Assessment Lower Extremity Assessment: Generalized weakness    Cervical / Trunk Assessment Cervical / Trunk Assessment: Normal  Communication   Communication: No difficulties  Cognition Arousal/Alertness: Awake/alert Behavior During Therapy: WFL for tasks assessed/performed Overall Cognitive Status: Within Functional Limits for tasks assessed  General Comments      Exercises     Assessment/Plan    PT Assessment Patient needs continued PT services  PT Problem List Decreased activity tolerance;Decreased balance;Decreased mobility;Decreased knowledge of use of DME;Decreased safety awareness;Decreased knowledge of  precautions;Cardiopulmonary status limiting activity       PT Treatment Interventions DME instruction;Gait training;Functional mobility training;Therapeutic activities;Therapeutic exercise;Balance training;Patient/family education    PT Goals (Current goals can be found in the Care Plan section)  Acute Rehab PT Goals Patient Stated Goal: to go home PT Goal Formulation: With patient Time For Goal Achievement: 01/22/19 Potential to Achieve Goals: Good    Frequency Min 3X/week   Barriers to discharge        Co-evaluation               AM-PAC PT "6 Clicks" Mobility  Outcome Measure Help needed turning from your back to your side while in a flat bed without using bedrails?: None Help needed moving from lying on your back to sitting on the side of a flat bed without using bedrails?: None Help needed moving to and from a bed to a chair (including a wheelchair)?: None Help needed standing up from a chair using your arms (e.g., wheelchair or bedside chair)?: A Little Help needed to walk in hospital room?: A Little Help needed climbing 3-5 steps with a railing? : A Little 6 Click Score: 21    End of Session Equipment Utilized During Treatment: Gait belt Activity Tolerance: Patient limited by fatigue Patient left: in chair;with call bell/phone within reach;with chair alarm set Nurse Communication: Mobility status PT Visit Diagnosis: Muscle weakness (generalized) (M62.81)    Time: 1340-1405 PT Time Calculation (min) (ACUTE ONLY): 25 min   Charges:   PT Evaluation $PT Eval Moderate Complexity: 1 Mod PT Treatments $Gait Training: 8-22 mins        [place] Pager:  [phone]  Office:  [phone]    Denice Paradise 01/08/2019, 3:38 PM

## 2019-01-08 NOTE — Progress Notes (Signed)
Subjective:  Feels much better  HR and BP improved. Lab abnormalities of increased LFT's, INR.  Objective:  Vital Signs in the last 24 hours: Temp:  [96.9 F (36.1 C)-98.4 F (36.9 C)] 98 F (36.7 C) (06/08 1624) Pulse Rate:  [72-129] 76 (06/08 1100) Resp:  [15-20] 15 (06/08 1100) BP: (105-147)/(49-71) 140/59 (06/08 1100) SpO2:  [90 %-100 %] 97 % (06/08 1100) Weight:  [86.6 kg] 86.6 kg (06/08 0500)  Intake/Output from previous day: 06/07 0701 - 06/08 0700 In: 1123.7 [I.V.:666.4; Blood:248; IV Piggyback:209.3] Out: -   Physical Exam Constitutional: He is oriented to person, place, and time. He appears well-developed and well-nourished. No distress.  HENT:  Head: Normocephalic and atraumatic.  Eyes: Pupils are equal, round, and reactive to light. Conjunctivae are normal.  Neck: No JVD present.  Cardiovascular: Normal rate and regular rhythm.  Murmur heard.  Systolic murmur is present with a grade of 3/6. Pulses:      Femoral pulses are 3+ on the right side and 3+ on the left side.      Dorsalis pedis pulses are 0 on the right side and 0 on the left side.       Posterior tibial pulses are 0 on the right side and 0 on the left side.  Left foot nonhealing wounds   Pulmonary/Chest: Effort normal and breath sounds normal. He has no wheezes. He has no rales.  Abdominal: Soft. Bowel sounds are normal. There is no rebound.  Musculoskeletal:        General: No edema.  Lymphadenopathy:    He has no cervical adenopathy.  Neurological: He is alert and oriented to person, place, and time. No cranial nerve deficit.  Skin: Skin is warm and dry.  Psychiatric: He has a normal mood and affect.  Nursing note and vitals reviewed.  Lab Results: BMP Recent Labs    01/07/19 0028 01/07/19 0454 01/08/19 0412  NA 136 134*  134* 136  K 5.3* 5.4*  5.4* 4.1  CL 95* 96*  95* 96*  CO2 20* 19*  21* 23  GLUCOSE 246* 282*  281* 102*  BUN 64* 67*  67* 77*  CREATININE 10.28* 10.34*   10.08* 11.50*  CALCIUM 7.9* 7.7*  7.8* 7.9*  GFRNONAA 4* 4*  4* 4*  GFRAA 5* 5*  5* 4*    CBC Recent Labs  Lab 01/06/19 1839  01/08/19 0412  WBC 14.8*   < > 9.5  RBC 2.87*   < > 2.44*  HGB 8.2*   < > 7.0*  HCT 25.0*   < > 21.1*  PLT 167   < > 98*  MCV 87.1   < > 86.5  MCH 28.6   < > 28.7  MCHC 32.8   < > 33.2  RDW 14.4   < > 14.5  LYMPHSABS 0.8  --   --   MONOABS 1.1*  --   --   EOSABS 0.0  --   --   BASOSABS 0.0  --   --    < > = values in this interval not displayed.    HEMOGLOBIN A1C No results found for: HGBA1C, MPG  Cardiac Panel (last 3 results) Recent Labs    01/06/19 1839 01/07/19 0028 01/07/19 0454  TROPONINI 0.26* 0.30* 0.31*    BNP (last 3 results) Recent Labs    01/06/19 1839  BNP 3,496.8*     Hepatic Function Panel Recent Labs    01/06/19 1839 01/07/19 0454 01/08/19 6384  PROT 5.9* 5.3* 5.4*  ALBUMIN 2.6* 2.5* 2.5*  AST 4,014* 9,355* 2,855*  ALT 2,081* 3,594* 2,406*  ALKPHOS 134* 144* 143*  BILITOT 1.6* 1.0 1.2   CARDIAC STUDIES:  EKG 01/06/2019: Marked sinus bradycardia 42 bpm RBBB. First degree AV block Nonspecific ST-T changes  Echocardiogram 01/08/2019:  1. The left ventricle has normal systolic function, with an ejection fraction of 55-60%. The cavity size was normal. mitral annular calcification.  2. The right ventricle has normal systolic function. The cavity was normal. There is no increase in right ventricular wall thickness.  3. Left atrial size was mildly dilated.  4. Mild calcific aortic stenosis. mean PG 10 mmHG. AVA 2.0 cm2. Moderate calcification of the aortic valve. Mild stenosis of the aortic valve.  5. The mitral valve is abnormal.Moderate calcification of the mitral valve leaflet. There is severe mitral annular calcification present. Mild mitral valve stenosis. Mean PG 8 mmHg at HR 75 bpm. MVA 1.6 cm2 by contiuity equation.  6. Mild tricuspid regurgitaation. Estimated RVSP 47 mmHg.  7. Moderate pleural  effusion in the left lateral region.  Assessment & Recommendations:  73 y.o. Caucasian male  with hypertension, type 2 DM, ESRD on HD, PAD with recent intervention on LLE at [facility], ?h/o CAD, admitted at [facility] ICU with elevated lover enzymes bradycardia.  Bradycardia: Sinus bradycardia, first degree AV block, RBBB. No sinus pause/arrest/high grade AV block seen. Multiple metabolic derangements will need to be reversed before we can definitively attribute bradycardia and chronotropic incompetence to hypotension/ shock liver picture. Also, patient reportedly is on BB/CCB at baseline. HR much improved on 01/08/2019 suggesting underlying metabolic etiology. Agree with weaning off glucagon and pressors.  No indication for pacemaker.  Mild valvular disease: Mild calcific AS and mild calcific MS. Not uncommon in ESRD patients. Does not need any intervention at this point.   Circulatory shock: Resolved.  Acute liver injury: Improving. He has pulmonary hypertension, but do not see right heart failure. Workup and management as per the primary team.  Troponin elevation: Type 2 MI. Do not suspect ACS. Recommend outpatient follow up with his cardiologist after discharge.  Cardiology will sign off. Please call us back if you have any questions.   Nigel Mormon, M.D. 01/08/2019, 5:13 PM [facility], [place] Pager: [phone] Office: [phone] If no answer: [phone]

## 2019-01-08 NOTE — Progress Notes (Signed)
Pt transferred via bed and monitor with transporter and this RN to HD.  VS stable.

## 2019-01-08 NOTE — Progress Notes (Signed)
[facility] Progress Note    Assessment/ Plan:   73 y.o. male with ESRD 2/2 DM type 2 (chronic HD TTS followed by [facility]) ho  PAD with recent PCI to rt Leg  at [facility].  Patient was initially admitted to [facility] with 5 days of N/V ,generalized weakness, and lightheadedness. Workup is remarkable for elevated liver enzymes) Workup there revealed acute hepatitis of unclear origin.   Dialysis Orders: Center: [facility]   on TTS . EDW :"185 pounds " HD [place]  Time 4hr Heparin none . Access LUA BCF      Assessment/Plan 1. ESRD -   HD planned for today.  2. Shock,- per admit team =SvO2 argues against cardiogenic. Pressor requirements improved today with supportive care. 3. Elevated INR  With liver injury - RX per Admit  Use no hep hd ( was on Coumadin for PAD) 4.  Acute liver injuryof unclear origin= wu per admit team  5. HO PAD recent R LE procedure / l foot ulcers. Lt 1st toe erythematous 6. Anemia  -  hgb 7.2 , May need transfusion on hd tomor / check with op kid center for ESA use  7. Metabolic bone disease -  Fu ca/phos / ck op unit for hd vit d use  No binders listed , thinks he was on Renvela as binder. Phos 9.2 Renvela 3 tabs TIDM  Subjective:   Fatigued more than anything else, mild dyspnea, no cough. No n/v.   Objective:   BP (!) 140/59   Pulse 76   Temp 98 F (36.7 C) (Oral)   Resp 15   Ht 5\' 10"  (1.778 m)   Wt 86.6 kg   SpO2 97%   BMI 27.39 kg/m   Intake/Output Summary (Last 24 hours) at 01/08/2019 1713 Last data filed at 01/08/2019 0600 Gross per 24 hour  Intake 233 ml  Output -  Net 233 ml   Weight change: 3 kg  Physical Exam: GEN: NAD, A&Ox3, NCAT HEENT: No conjunctival pallor, EOMI NECK: Supple, no thyromegaly LUNGS: CTA B/L no rales, rhonchi or wheezing CV: RRR, No M/R/G ABD: SNDNT +BS  EXT: Lt GT erythejmatous ACCESS: lt BCF + bruit    Imaging: US Abdomen Complete  Result Date:  01/07/2019 CLINICAL DATA:  Patient with liver failure.  Prior cholecystectomy. EXAM: ABDOMEN ULTRASOUND COMPLETE COMPARISON:  None. FINDINGS: Gallbladder: Surgically absent Common bile duct: Diameter: 6 mm Liver: Nodular in contour. No focal lesion identified. Portal vein is patent on color Doppler imaging with normal direction of blood flow towards the liver. IVC: No abnormality visualized. Pancreas: Visualized portion unremarkable. Spleen: Size and appearance within normal limits. Right Kidney: Length: 9.1 cm. Renal cortical thinning. No hydronephrosis. Mild increased renal cortical echogenicity. Left Kidney: Length: 9.3 cm. Renal cortical thinning. No hydronephrosis. Mild increased renal cortical echogenicity. Abdominal aorta: No aneurysm visualized. Other findings: Small volume ascites.  Right pleural effusion. IMPRESSION: 1. Morphologic changes to the liver raising the possibility of cirrhosis. 2. Small volume ascites. 3. Right pleural effusion. 4. Mildly atrophic kidneys bilaterally with increased cortical echogenicity as can be seen with chronic medical renal disease. Electronically Signed   By: Lovey Newcomer M.D.   On: 01/07/2019 11:00   Dg Chest Port 1 View  Result Date: 01/06/2019 CLINICAL DATA:  Shortness of breath, liver injury, sepsis EXAM: PORTABLE CHEST 1 VIEW COMPARISON:  None. FINDINGS: Cardiomegaly. Right neck vascular catheter, tip projecting over the mid SVC. There is a small left  pleural effusion with associated atelectasis or consolidation. There may be a trace right pleural effusion. IMPRESSION: 1. There is a small left pleural effusion with associated atelectasis or consolidation. There may be a trace right pleural effusion. 2.  Cardiomegaly. Electronically Signed   By: Eddie Candle M.D.   On: 01/06/2019 18:45    Labs: BMET Recent Labs  Lab 01/06/19 1839 01/07/19 0028 01/07/19 0454 01/08/19 0412  NA 134* 136 134*  134* 136  K 6.6* 5.3* 5.4*  5.4* 4.1  CL 95* 95* 96*  95* 96*   CO2 17* 20* 19*  21* 23  GLUCOSE 150* 246* 282*  281* 102*  BUN 59* 64* 67*  67* 77*  CREATININE 10.04* 10.28* 10.34*  10.08* 11.50*  CALCIUM 7.9* 7.9* 7.7*  7.8* 7.9*  PHOS 9.6*  --  9.2*  --    CBC Recent Labs  Lab 01/06/19 1839 01/07/19 0454 01/08/19 0412  WBC 14.8* 12.8* 9.5  NEUTROABS 12.8*  --   --   HGB 8.2* 7.2* 7.0*  HCT 25.0* 22.1* 21.1*  MCV 87.1 87.4 86.5  PLT 167 123* 98*    Medications:    . arformoterol  15 mcg Nebulization BID  . aspirin  325 mg Oral Daily  . budesonide (PULMICORT) nebulizer solution  0.25 mg Nebulization BID  . insulin aspart  0-9 Units Subcutaneous TID AC & HS  . ipratropium-albuterol  3 mL Nebulization BID  . loratadine  10 mg Oral Daily  . mirtazapine  7.5 mg Oral QHS  . pantoprazole (PROTONIX) IV  40 mg Intravenous QHS      Otelia Santee, MD 01/08/2019, 5:13 PM

## 2019-01-08 NOTE — Progress Notes (Signed)
eLink Physician-Brief Progress Note Patient Name: Frederick Kline DOB: Dec 01, 1945 MRN: [id number]   Date of Service  01/08/2019  HPI/Events of Note  Patient c/o "toe pain". Pulses intact to foot. Patient is on Ultram at home.   eICU Interventions  Will order: 1. Ultram 25 mg PO X 1 now.      Intervention Category Major Interventions: Other:  Lysle Dingwall 01/08/2019, 3:53 AM

## 2019-01-08 NOTE — Progress Notes (Addendum)
NAME:  Biff Rutigliano, MRN:  [id number], DOB:  01-22-1946, LOS: 2 ADMISSION DATE:  01/06/2019, CONSULTATION DATE:  01/06/19 REFERRING MD:  [place]:  N/V   Brief History   73 year old man with multitude of medical problems detailed below presenting with 5 days of N/V to [place] center.  Workup there revealed acute hepatitis of unclear origin.  Then developed shock and transferred to [facility] for further care.  Past Medical History  HTN DM2 ESRD on HD TuWedSat PVD s/p angioplasty Gastroparesis, DM COPD OSA not on CPAP CAD  Significant Hospital Events   01/06/19 Admitted  Consults:  Cardiology 6/6  Procedures:  01/06/19   Significant Diagnostic Tests:  Pending  Micro Data:  Blood cultures 6/6 pending  Antimicrobials:  Vancomycin 01/06/19 - Cefepime 01/06/19 -  Interim history/subjective:  Continues to improve.  N/V subsiding, tolerating diet.  HR now in 80s.  LFTs going in right direction. Wants to start walking around.  Objective   Blood pressure (!) 131/54, pulse 72, temperature (!) 97.5 F (36.4 C), temperature source Oral, resp. rate 15, height 5\' 10"  (1.778 m), weight 86.6 kg, SpO2 100 %. CVP:  [10 mmHg-14 mmHg] 12 mmHg      Intake/Output Summary (Last 24 hours) at 01/08/2019 0816 Last data filed at 01/07/2019 2300 Gross per 24 hour  Intake 956.86 ml  Output -  Net 956.86 ml   Filed Weights   01/06/19 2000 01/07/19 0454 01/08/19 0500  Weight: 83.6 kg 84.3 kg 86.6 kg    Examination: General: ill appearing man in NAD HENT: +JVD, MMM Lungs: diminished bases, no accessory muscle use Cardiovascular: bradycardic, +systolic murmur, ext warm Abdomen: nontender, no hepatomegaly Extremities: no edema Neuro: moving all 4 ext to command GU: deferred Skin: minimal jaundice  Resolved Hospital Problem list   # Shock, SvO2 argues against cardiogenic.  Cultures unrevealing. Suspect distributive related to acute hepatic injury.  Resolved with  supportive care. # Inappropriate chronotropic response in setting of CCB and BB use but timing does not quit match up.  Again suspect related to liver-induced metabolic derangements.  Assessment & Plan:   # Acute liver injury of unclear origin- intrinsic injury, does have elevated ferritin, will send rest of [place] labs but hx not c/w this.  Awaiting acute hepatitis labs, by verbal report these were negative at [place] but do not have records of this.  US showing normal portal vein flow, cirrhotic changes.  Has risk factors for NASH but denies EtOH hx.  Regardless of cause, improving with supportive care. # Coagulopathy related to acute liver injury improved with FFP and vitamin K # Anemia, slow drop, no s/s of bleeding, HD stable # Underlying advanced DM, HTN, HLD, COPD, PVD, ESRD  - f/u echo - f/u acute liver failure labs - stop antibiotics - decrease insulin given low BS this AM - OOB, PT/OT consults - Trend LFTs, INR, CBC - Hold warfarin for now, given liver injury I think DoAC may be preferable but the indication for the [facility] is questionable; I am reaching out to [facility] to discuss after reviewing the notes from his recent admission there - okay for tele floor, TRH will take over as primary tomorrow, appreciate help   Best practice:  Diet: renal/diabetic diet Pain/Anxiety/Delirium protocol (if indicated): tramadol PRN VAP protocol (if indicated): NA DVT prophylaxis: SCDs given coagulopathy for now GI prophylaxis: PPI Glucose control: SSI Mobility: up ad lib Code Status: full, confirmed on admission Family Communication:  updated patient at time of evaluation Disposition: med tele   Addendum: spoke with vascular at [facility], fine for ASA 325mg  for now.  Did have popliteal thrombus requiring arterial lytics initially.

## 2019-01-09 ENCOUNTER — Other Ambulatory Visit: Payer: Self-pay

## 2019-01-09 ENCOUNTER — Encounter (HOSPITAL_COMMUNITY): Payer: Self-pay

## 2019-01-09 DIAGNOSIS — B179 Acute viral hepatitis, unspecified: Secondary | ICD-10-CM

## 2019-01-09 DIAGNOSIS — D696 Thrombocytopenia, unspecified: Secondary | ICD-10-CM

## 2019-01-09 DIAGNOSIS — D689 Coagulation defect, unspecified: Secondary | ICD-10-CM

## 2019-01-09 DIAGNOSIS — Z992 Dependence on renal dialysis: Secondary | ICD-10-CM

## 2019-01-09 LAB — CBC
HCT: 22.9 % — ABNORMAL LOW (ref 39.0–52.0)
Hemoglobin: 7.7 g/dL — ABNORMAL LOW (ref 13.0–17.0)
MCH: 28.4 pg (ref 26.0–34.0)
MCHC: 33.6 g/dL (ref 30.0–36.0)
MCV: 84.5 fL (ref 80.0–100.0)
Platelets: 90 10*3/uL — ABNORMAL LOW (ref 150–400)
RBC: 2.71 MIL/uL — ABNORMAL LOW (ref 4.22–5.81)
RDW: 14.3 % (ref 11.5–15.5)
WBC: 7.3 10*3/uL (ref 4.0–10.5)
nRBC: 0 % (ref 0.0–0.2)

## 2019-01-09 LAB — PROTIME-INR
INR: 2.1 — ABNORMAL HIGH (ref 0.8–1.2)
Prothrombin Time: 22.8 seconds — ABNORMAL HIGH (ref 11.4–15.2)

## 2019-01-09 LAB — CERULOPLASMIN: Ceruloplasmin: 25.7 mg/dL (ref 16.0–31.0)

## 2019-01-09 LAB — COMPREHENSIVE METABOLIC PANEL
ALT: 1729 U/L — ABNORMAL HIGH (ref 0–44)
AST: 879 U/L — ABNORMAL HIGH (ref 15–41)
Albumin: 2.5 g/dL — ABNORMAL LOW (ref 3.5–5.0)
Alkaline Phosphatase: 147 U/L — ABNORMAL HIGH (ref 38–126)
Anion gap: 14 (ref 5–15)
BUN: 32 mg/dL — ABNORMAL HIGH (ref 8–23)
CO2: 26 mmol/L (ref 22–32)
Calcium: 8.3 mg/dL — ABNORMAL LOW (ref 8.9–10.3)
Chloride: 96 mmol/L — ABNORMAL LOW (ref 98–111)
Creatinine, Ser: 6.02 mg/dL — ABNORMAL HIGH (ref 0.61–1.24)
GFR calc Af Amer: 10 mL/min — ABNORMAL LOW (ref >60)
GFR calc non Af Amer: 8 mL/min — ABNORMAL LOW (ref >60)
Glucose, Bld: 99 mg/dL (ref 70–99)
Potassium: 3.4 mmol/L — ABNORMAL LOW (ref 3.5–5.1)
Sodium: 136 mmol/L (ref 135–145)
Total Bilirubin: 1.1 mg/dL (ref 0.3–1.2)
Total Protein: 5.5 g/dL — ABNORMAL LOW (ref 6.5–8.1)

## 2019-01-09 LAB — GLUCOSE, CAPILLARY
Glucose-Capillary: 99 mg/dL (ref 70–99)
Glucose-Capillary: 80 mg/dL (ref 70–99)
Glucose-Capillary: 163 mg/dL — ABNORMAL HIGH (ref 70–99)
Glucose-Capillary: 167 mg/dL — ABNORMAL HIGH (ref 70–99)
Glucose-Capillary: 135 mg/dL — ABNORMAL HIGH (ref 70–99)

## 2019-01-09 MED ORDER — PANTOPRAZOLE SODIUM 40 MG PO TBEC
40.0000 mg | DELAYED_RELEASE_TABLET | Freq: Every day | ORAL | Status: DC
  Administered 2019-01-09 – 2019-01-11 (×3): 40 mg via ORAL
  Filled 2019-01-09 (×4): qty 1

## 2019-01-09 MED ORDER — INSULIN ASPART 100 UNIT/ML ~~LOC~~ SOLN
0.0000 [IU] | Freq: Three times a day (TID) | SUBCUTANEOUS | Status: DC
  Administered 2019-01-09 – 2019-01-10 (×2): 2 [IU] via SUBCUTANEOUS
  Administered 2019-01-10: 1 [IU] via SUBCUTANEOUS

## 2019-01-09 NOTE — Care Management Important Message (Signed)
Important Message  Patient Details  Name: Frederick Kline MRN: [id number] Date of Birth: 21-Feb-1946   Medicare Important Message Given:  Yes    Zareya Tuckett 01/09/2019, 3:12 PM

## 2019-01-09 NOTE — Progress Notes (Signed)
Patient refusing bed alarm and states, "I'm not incapacitated to where I can't get up on my own." I educated patient on the importance of the bed alarm to help prevent him from falling. Patient stated, "I will call you if I need to get up." Bed alarm turned off and patient educated on how to call if he needs assistance.

## 2019-01-09 NOTE — Progress Notes (Signed)
PROGRESS NOTE   Boby Eyer  GYI:[id number]    DOB: 12-Nov-1945    DOA: 01/06/2019  PCP: No primary care provider on file.   I have briefly reviewed patients previous medical records in [facility].  Brief Narrative:  PCCM to [facility] transfer 01/09/2019: 73 year old male, undergoes care at the [place], [place] of DM 2 with peripheral neuropathy, gastroparesis, HTN, HLD, GERD, PAD status post recent angioplasty 5/20 at [facility] for acute limb threatening ischemia of right leg after which he was started on Coumadin, ESRD on TTS HD, COPD on as needed home oxygen, OSA on CPAP, chronic systolic CHF, chronic low back pain status post spinal stimulator, s/p appendectomy, cholecystectomy, EGD/colonoscopy 11/2017 with descending colon tubular adenoma with polypectomy, EGD: 11/2017 suggestive of moderate duodenitis, normal biopsy but otherwise unremarkable, initially hospitalized at the [place] due to recurrent episodes of nausea, vomiting and inability to tolerate p.o., noted to have acute hepatitis/acute transaminitis of unclear etiology.  He then developed shock and bradycardia and was transferred to [facility] ICU on 01/06/2019 under CCM service for further evaluation and management.   Assessment & Plan:   Active Problems:   LFTs abnormal   Hepatitis   Shock ([place])   ESRD (end stage renal disease) ([place])   Acute liver failure without hepatic coma   1. Shock: As per CCM, suspect distributive related to acute hepatic injury.  Less likely to be cardiogenic.  Resolved with supportive care.  Had required central line and vasopressors while transfer from OSH. 2. Symptomatic bradycardia: May be related to calcium channel blockers and beta-blockers or due to liver induced metabolic derangements.  Required transcutaneous pacer and glucagon for beta-blocker reversal at OSH.  Cardiology was consulted.  They did not see any sinus pause/arrest or high-grade AV block.  They felt that this was because of metabolic  derangements.  No indication for pacemaker.  Cardiology signed off 6/8. 3. Acute hepatitis: Unclear etiology.  Initially presented to OSH with ALT in the 600 and AST in the 1100 range.  It appears that patient had GI consultation and acute hepatitis, CMV, EBV and HSV serology were sent off from OSH- order placed to request results from there.  As per CCM, acute hepatitis labs verbal report from OSH were negative.  Ultrasound showed normal portal vein and cirrhotic changes.  Patient denies alcohol use.  No h/o herbal supplements.  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  AST had peaked to 9300 and ALT 3500 on 6/7, likely worsened by shock.  Since then transaminitis has steadily improved AST 879, ALT 1729, normal bilirubin and unremarkable alkaline phosphatase.  Ehrlichia, CMV, alpha-1 antitrypsin serology 6/9-pending.  HSV PCR 6/7-pending.  Ceruloplasmin, acetaminophen level normal, ANA: Negative.  Ferritin >7500.  Acute hepatitis panel: Negative.  Follow daily LFTs. 4. Coagulopathy:?  Related to acute liver injury.  Not sure if he was on Coumadin.  Improved with FFP and vitamin K.  No bleeding reported. 5. Anemia: Hemoglobin has gradually drifted down from 9 range to the 7 range.  No overt bleeding.  Follow CBCs closely and transfuse if hemoglobin 7 or less. 6. Thrombocytopenia:?  Related to acute hepatitis.  Seems to be stabilizing, platelet count in the 90s for the last 2 days.  Monitor CBCs closely. 7. PAD status post recent intervention 5/20: As per CCM follow-up with [facility], he did have popliteal thrombus requiring arterial lytics initially.  They indicated that he should be fine for aspirin 325 mg  daily.  Thereby may not need anticoagulation. 8. Elevated troponin: As per cardiology type II MI and do not suspect ACS.  Outpatient follow-up with his cardiologist. 9. ESRD on HD: Nephrology consulting for his regular TTS dialysis needs. 10. Essential hypertension:  Mildly hypertensive now.  Off of antihypertensives due to recent shock.  Monitor and if continues to increase then may need to reinitiate but would avoid beta-blockers, calcium channel blockers or anything that would cause bradycardia. 11. Hyperlipidemia: Not on statins now due to acute hepatitis. 12. GERD: Continue PPI. 13. Type II DM with peripheral neuropathy/renal complications: CBGs reasonably controlled on SSI alone. 14. COPD/OSA on CPAP: Stable.  Continue nightly CPAP. 15. Chronic systolic CHF/cardiomyopathy: Euvolemic.  TTE 6/8: LVEF 55-60%.  EF has improved compared to prior (40-45%). 16. Intractable nausea and vomiting: Initial presentation at OSH could have been related to his acute hepatitis versus diabetic gastroparesis flare.  Seems to have resolved.   DVT prophylaxis: SCDs Code Status: Full Family Communication: None at bedside Disposition: DC home pending clinical improvement, possibly in the next 24 to 48 hours.   Consultants:  CCM Cardiology Nephrology  Procedures:  HD  Antimicrobials:  All discontinued.   Subjective: Patient denies complaints.  No chest pain, dyspnea, abdominal pain, nausea, vomiting or diarrhea.  Tolerating diet.  No history of alcohol use, insect bites, using herbal supplements.  Denies recent change in medications  ROS: As above, otherwise negative  Objective:  Vitals:   01/09/19 0230 01/09/19 0308 01/09/19 0324 01/09/19 0829  BP: (!) 160/68 (!) 166/67 (!) 163/61 (!) 149/71  Pulse: 72 72 75 79  Resp: 16 16 16 18   Temp:  98 F (36.7 C) 97.8 F (36.6 C) 97.7 F (36.5 C)  TempSrc:  Oral Oral Oral  SpO2: 100% 98% 97% 100%  Weight:  86.6 kg 86.3 kg   Height:        Examination:  General exam: Is not elderly male, moderately built and nourished lying comfortably propped up in bed without distress. Respiratory system: Clear to auscultation. Respiratory effort normal. Cardiovascular system: S1 & S2 heard, RRR. No JVD, murmurs, rubs,  gallops or clicks. No pedal edema.  Telemetry personally reviewed: Sinus rhythm with first-degree AV block. Gastrointestinal system: Abdomen is nondistended, soft and nontender. No organomegaly or masses felt. Normal bowel sounds heard. Central nervous system: Alert and oriented. No focal neurological deficits. Extremities: Symmetric 5 x 5 power. Skin: No rashes, lesions or ulcers Psychiatry: Judgement and insight appear normal. Mood & affect appropriate.     Data Reviewed: I have personally reviewed following labs and imaging studies  CBC: Recent Labs  Lab 01/06/19 1839 01/07/19 0454 01/08/19 0412 01/09/19 0615  WBC 14.8* 12.8* 9.5 7.3  NEUTROABS 12.8*  --   --   --   HGB 8.2* 7.2* 7.0* 7.7*  HCT 25.0* 22.1* 21.1* 22.9*  MCV 87.1 87.4 86.5 84.5  PLT 167 123* 98* 90*   Basic Metabolic Panel: Recent Labs  Lab 01/06/19 1839 01/07/19 0028 01/07/19 0454 01/08/19 0412 01/09/19 0615  NA 134* 136 134*  134* 136 136  K 6.6* 5.3* 5.4*  5.4* 4.1 3.4*  CL 95* 95* 96*  95* 96* 96*  CO2 17* 20* 19*  21* 23 26  GLUCOSE 150* 246* 282*  281* 102* 99  BUN 59* 64* 67*  67* 77* 32*  CREATININE 10.04* 10.28* 10.34*  10.08* 11.50* 6.02*  CALCIUM 7.9* 7.9* 7.7*  7.8* 7.9* 8.3*  MG 2.1  --  2.0  --   --  PHOS 9.6*  --  9.2*  --   --    Liver Function Tests: Recent Labs  Lab 01/06/19 1839 01/07/19 0454 01/08/19 0412 01/09/19 0615  AST 4,014* 9,355* 2,855* 879*  ALT 2,081* 3,594* 2,406* 1,729*  ALKPHOS 134* 144* 143* 147*  BILITOT 1.6* 1.0 1.2 1.1  PROT 5.9* 5.3* 5.4* 5.5*  ALBUMIN 2.6* 2.5* 2.5* 2.5*   Coagulation Profile: Recent Labs  Lab 01/07/19 0822 01/07/19 1726 01/08/19 0412 01/08/19 1611 01/09/19 0615  INR 5.6* 3.0* 2.9* 2.6* 2.1*   Cardiac Enzymes: Recent Labs  Lab 01/06/19 1839 01/07/19 0028 01/07/19 0454  TROPONINI 0.26* 0.30* 0.31*   CBG: Recent Labs  Lab 01/08/19 2034 01/08/19 2159 01/09/19 0334 01/09/19 0705 01/09/19 1059  GLUCAP 130*  143* 99 80 163*    Recent Results (from the past 240 hour(s))  MRSA PCR Screening     Status: None   Collection Time: 01/06/19  5:33 PM  Result Value Ref Range Status   MRSA by PCR NEGATIVE NEGATIVE Final    Comment:        The GeneXpert MRSA Assay (FDA approved for NASAL specimens only), is one component of a comprehensive MRSA colonization surveillance program. It is not intended to diagnose MRSA infection nor to guide or monitor treatment for MRSA infections. Performed at [facility], [place] [address], [place], [place] [address]   Culture, blood (routine x 2)     Status: None (Preliminary result)   Collection Time: 01/06/19  6:34 PM  Result Value Ref Range Status   Specimen Description BLOOD RIGHT HAND  Final   Special Requests AEROBIC BOTTLE ONLY Blood Culture adequate volume  Final   Culture   Final    NO GROWTH 3 DAYS Performed at [facility], [place] [address], [place], [place] [address]    Report Status PENDING  Incomplete  Culture, blood (routine x 2)     Status: None (Preliminary result)   Collection Time: 01/06/19  6:53 PM  Result Value Ref Range Status   Specimen Description BLOOD RIGHT HAND  Final   Special Requests   Final    AEROBIC BOTTLE ONLY Blood Culture results may not be optimal due to an inadequate volume of blood received in culture bottles   Culture   Final    NO GROWTH 3 DAYS Performed at [facility], [place] [address] [place], [place] [address]    Report Status PENDING  Incomplete         Radiology Studies: No results found.      Scheduled Meds: . arformoterol  15 mcg Nebulization BID  . aspirin  325 mg Oral Daily  . budesonide (PULMICORT) nebulizer solution  0.25 mg Nebulization BID  . insulin aspart  0-9 Units Subcutaneous TID PC & HS  . ipratropium-albuterol  3 mL Nebulization BID  . loratadine  10 mg Oral Daily  . mirtazapine  7.5 mg Oral QHS  . pantoprazole  40 mg Oral Daily  . sevelamer carbonate  2,400 mg  Oral TID WC   Continuous Infusions: . sodium chloride 10 mL/hr at 01/08/19 0600     LOS: 3 days     Vernell Leep, MD, [place], [facility]. [facility]  To contact the attending provider between 7A-7P or the covering provider during after hours 7P-7A, please log into the web site [email or web address] and access using universal [place] password for that web site. If you do not have the password, please call the hospital  operator.  01/09/2019, 12:14 PM

## 2019-01-09 NOTE — Progress Notes (Signed)
Transfer Note:   Arrival Method: Arrived via bed  from HD. Patient was from 40M but to HD first. Mental Orientation: Alert and oriented x4 Telemetry: Box #22 Assessment: Completed Skin: See doc flowsheet IV: NSL-R Hand Pain: Denies Tubes: N/A Safety Measures: Safety Fall Prevention Plan has been discussed.   5MW Orientation: Patient has been orientated to the room, unit and staff.     Will continue to monitor the patient. Call light has been placed within reach and bed alarm has been activated.   Sacred [employer], RN-BC Phone number: [phone]

## 2019-01-09 NOTE — Progress Notes (Signed)
Occupational Therapy Evaluation Patient Details Name: Frederick Kline MRN: [id number] DOB: [birth year] Today's Date: 01/09/2019    History of Present Illness 73 year old man with multitude of medical problems detailed below presenting with 5 days of N/V to [place] center.  Workup there revealed acute hepatitis of unclear origin.  Then developed shock and transferred to [facility] for further care.  PMH: HTn, DM, ESRD, COPD   Clinical Impression   Pt presented with above description. PTA pt PLOF Mod I with increased time and use of AE with some additional assistance as needed from aide. Pt currently requires Min Guard in functional mobiltiy for safety and supervision in standing for functional tasks for safety. Pt will benefit from continued acute OT to address deficits. DC to home health OT to maximize independence and safety in home environment. OT will continue to follow acutely.    Follow Up Recommendations  Home health OT    Equipment Recommendations  3 in 1 bedside commode    Recommendations for Other Services       Precautions / Restrictions Precautions Precautions: None Restrictions Weight Bearing Restrictions: No      Mobility Bed Mobility Overal bed mobility: Independent                Transfers Overall transfer level: Needs assistance Equipment used: Rolling walker (2 wheeled) Transfers: Sit to/from Stand Sit to Stand: Modified independent (Device/Increase time)              Balance Overall balance assessment: Needs assistance Sitting-balance support: No upper extremity supported;Feet supported Sitting balance-Leahy Scale: Fair     Standing balance support: No upper extremity supported;During functional activity Standing balance-Leahy Scale: Fair Standing balance comment: RW provided for safety                           ADL either performed or assessed with clinical judgement   ADL Overall ADL's : Needs  assistance/impaired Eating/Feeding: Set up;Sitting   Grooming: Wash/dry hands;Standing;Supervision/safety   Upper Body Bathing: Modified independent   Lower Body Bathing: Supervison/ safety;Sitting/lateral leans   Upper Body Dressing : Modified independent   Lower Body Dressing: Supervision/safety;Sitting/lateral leans   Toilet Transfer: Min guard;Ambulation;RW Toilet Transfer Details (indicate cue type and reason): Simulated toilet transfer from sink level activity to bed with RW. VC required for hand placement with RW.         Functional mobility during ADLs: Min guard;Rolling walker;Cueing for sequencing General ADL Comments: Pt desat during functional task. Initial 97% at 2L O2 (supine), 98% (sitting EOB), standing task (92%).      Vision         Perception     Praxis      Pertinent Vitals/Pain Pain Assessment: Faces Faces Pain Scale: Hurts a little bit Pain Location: left foot Pain Descriptors / Indicators: Aching Pain Intervention(s): Monitored during session;Repositioned     Hand Dominance     Extremity/Trunk Assessment Upper Extremity Assessment Upper Extremity Assessment: Generalized weakness   Lower Extremity Assessment Lower Extremity Assessment: Defer to PT evaluation   Cervical / Trunk Assessment Cervical / Trunk Assessment: Normal   Communication Communication Communication: No difficulties   Cognition Arousal/Alertness: Awake/alert Behavior During Therapy: WFL for tasks assessed/performed Overall Cognitive Status: Within Functional Limits for tasks assessed  General Comments       Exercises     Shoulder Instructions      Home Living Family/patient expects to be discharged to:: Private residence Living Arrangements: Spouse/significant other(Best friend who is 63 years old per pt) Available Help at Discharge: Friend(s);Available 24 hours/day Type of Home: House Home Access: Stairs to  enter [employer] of Steps: 4 Entrance Stairs-Rails: Can reach both;Right;Left Home Layout: One level     Bathroom Shower/Tub: Occupational psychologist: Standard     Home Equipment: Environmental consultant - 4 wheels;Cane - single point(lift chair)          Prior Functioning/Environment Level of Independence: Independent with assistive device(s)        Comments: used cane at times per pt.  Has used rolattor as well.  Sponge bahting recently due to sickness        OT Problem List: Decreased activity tolerance;Impaired balance (sitting and/or standing);Decreased safety awareness;Decreased knowledge of use of DME or AE      OT Treatment/Interventions: Therapeutic exercise;Self-care/ADL training;DME and/or AE instruction;Patient/family education;Balance training    OT Goals(Current goals can be found in the care plan section) Acute Rehab OT Goals Patient Stated Goal: to go home OT Goal Formulation: With patient Time For Goal Achievement: 01/23/19 Potential to Achieve Goals: Good  OT Frequency: Min 2X/week   Barriers to D/C:            Co-evaluation              AM-PAC OT "6 Clicks" Daily Activity     Outcome Measure Help from another person eating meals?: None Help from another person taking care of personal grooming?: A Little Help from another person toileting, which includes using toliet, bedpan, or urinal?: A Little Help from another person bathing (including washing, rinsing, drying)?: A Little Help from another person to put on and taking off regular upper body clothing?: None Help from another person to put on and taking off regular lower body clothing?: A Little 6 Click Score: 20   End of Session Equipment Utilized During Treatment: Gait belt;Rolling walker Nurse Communication: Mobility status  Activity Tolerance: Patient tolerated treatment well Patient left: in bed;with call bell/phone within reach  OT Visit Diagnosis: Unsteadiness on feet  (R26.81);Muscle weakness (generalized) (M62.81)                Time: 0626-9485 OT Time Calculation (min): 30 min Charges:  OT General Charges $OT Visit: 1 Visit OT Evaluation $OT Eval Low Complexity: 1 Low OT Treatments $Self Care/Home Management : 8-22 mins  Minus Breeding, MSOT, OTR/L  Supplemental Rehabilitation Services  [phone]   Marius Ditch 01/09/2019, 10:08 AM

## 2019-01-09 NOTE — Progress Notes (Signed)
[facility] Progress Note    Assessment/ Plan:   73 y.o.malewith ESRD2/2 DM type 2(chronic HD TTS followed by [facility]) hoPAD with recent PCI to rt Leg at [facility].  Patient was initially admitted to [facility] with5 days of N/V,generalized weakness, and lightheadedness. Workup is remarkable for elevated liver enzymes)Workup there revealed acute hepatitis of unclear origin.   Dialysis Orders: [facility] EDW:"185 pounds "HD Bath Time 4hrHeparin none. AccessLUA BCF  Assessment/Plan 1. ESRD -HD very late last night into this AM; will plan next HD Thursday if he's still here.  2. Shock,- per admit team =SvO2 argues against cardiogenic. Pressor requirements improved today with supportive care. 3. Elevated INR With liver injury - RX per Admit Use no hep hd ( was on Coumadin for PAD) 4. Acute liver injuryof unclear origin= wu per admit team  5. HO PAD recent R LE procedure / l foot ulcers. Lt 1st toe erythematous 6. Anemia - hgb 7.7 , May need transfusion on hd tomor / check with op kid center for ESA use 7. Metabolic bone disease -Fu ca/phos / ck op unit for hd vit d use No binders listed , thinks he was on Renvela as binder. Phos 9.2 Renvela 3 tabs TIDM Repeat phos in AM.  Subjective:   Fatigued more than anything else, mild dyspnea, no cough. No n/v. Ambulating comfortably.   Objective:   BP (!) 149/71 (BP Location: Right Arm) Comment: took bp twice  Pulse 79   Temp 97.7 F (36.5 C) (Oral)   Resp 18   Ht 5\' 10"  (1.778 m)   Wt 86.3 kg   SpO2 100%   BMI 27.30 kg/m   Intake/Output Summary (Last 24 hours) at 01/09/2019 1328 Last data filed at 01/09/2019 1115 Gross per 24 hour  Intake 440 ml  Output 0 ml  Net 440 ml   Weight change: 0 kg  Physical Exam: GEN: NAD, A&Ox3, NCAT HEENT: No conjunctival pallor, EOMI NECK: Supple, no thyromegaly LUNGS: CTA B/L no rales, rhonchi or  wheezing CV: RRR, No M/R/G ABD: SNDNT +BS  EXT: Lt GT erythejmatous ACCESS: lt BCF + bruit  Imaging: No results found.  Labs: BMET Recent Labs  Lab 01/06/19 1839 01/07/19 0028 01/07/19 0454 01/08/19 0412 01/09/19 0615  NA 134* 136 134*  134* 136 136  K 6.6* 5.3* 5.4*  5.4* 4.1 3.4*  CL 95* 95* 96*  95* 96* 96*  CO2 17* 20* 19*  21* 23 26  GLUCOSE 150* 246* 282*  281* 102* 99  BUN 59* 64* 67*  67* 77* 32*  CREATININE 10.04* 10.28* 10.34*  10.08* 11.50* 6.02*  CALCIUM 7.9* 7.9* 7.7*  7.8* 7.9* 8.3*  PHOS 9.6*  --  9.2*  --   --    CBC Recent Labs  Lab 01/06/19 1839 01/07/19 0454 01/08/19 0412 01/09/19 0615  WBC 14.8* 12.8* 9.5 7.3  NEUTROABS 12.8*  --   --   --   HGB 8.2* 7.2* 7.0* 7.7*  HCT 25.0* 22.1* 21.1* 22.9*  MCV 87.1 87.4 86.5 84.5  PLT 167 123* 98* 90*    Medications:    . arformoterol  15 mcg Nebulization BID  . aspirin  325 mg Oral Daily  . budesonide (PULMICORT) nebulizer solution  0.25 mg Nebulization BID  . insulin aspart  0-9 Units Subcutaneous TID WC  . ipratropium-albuterol  3 mL Nebulization BID  . loratadine  10 mg Oral Daily  . mirtazapine  7.5 mg  Oral QHS  . pantoprazole  40 mg Oral Daily  . sevelamer carbonate  2,400 mg Oral TID WC      Otelia Santee, MD 01/09/2019, 1:28 PM

## 2019-01-10 ENCOUNTER — Encounter (HOSPITAL_COMMUNITY): Payer: Self-pay | Admitting: *Deleted

## 2019-01-10 DIAGNOSIS — K759 Inflammatory liver disease, unspecified: Secondary | ICD-10-CM

## 2019-01-10 DIAGNOSIS — K7200 Acute and subacute hepatic failure without coma: Principal | ICD-10-CM

## 2019-01-10 DIAGNOSIS — N186 End stage renal disease: Secondary | ICD-10-CM

## 2019-01-10 DIAGNOSIS — R579 Shock, unspecified: Secondary | ICD-10-CM

## 2019-01-10 DIAGNOSIS — R945 Abnormal results of liver function studies: Secondary | ICD-10-CM

## 2019-01-10 LAB — COMPREHENSIVE METABOLIC PANEL
ALT: 1213 U/L — ABNORMAL HIGH (ref 0–44)
AST: 345 U/L — ABNORMAL HIGH (ref 15–41)
Albumin: 2.5 g/dL — ABNORMAL LOW (ref 3.5–5.0)
Alkaline Phosphatase: 128 U/L — ABNORMAL HIGH (ref 38–126)
Anion gap: 12 (ref 5–15)
BUN: 43 mg/dL — ABNORMAL HIGH (ref 8–23)
CO2: 27 mmol/L (ref 22–32)
Calcium: 8.4 mg/dL — ABNORMAL LOW (ref 8.9–10.3)
Chloride: 95 mmol/L — ABNORMAL LOW (ref 98–111)
Creatinine, Ser: 7.36 mg/dL — ABNORMAL HIGH (ref 0.61–1.24)
GFR calc Af Amer: 8 mL/min — ABNORMAL LOW (ref >60)
GFR calc non Af Amer: 7 mL/min — ABNORMAL LOW (ref >60)
Glucose, Bld: 123 mg/dL — ABNORMAL HIGH (ref 70–99)
Potassium: 3.8 mmol/L (ref 3.5–5.1)
Sodium: 134 mmol/L — ABNORMAL LOW (ref 135–145)
Total Bilirubin: 0.9 mg/dL (ref 0.3–1.2)
Total Protein: 5.8 g/dL — ABNORMAL LOW (ref 6.5–8.1)

## 2019-01-10 LAB — CBC
HCT: 21.9 % — ABNORMAL LOW (ref 39.0–52.0)
Hemoglobin: 7.3 g/dL — ABNORMAL LOW (ref 13.0–17.0)
MCH: 28.3 pg (ref 26.0–34.0)
MCHC: 33.3 g/dL (ref 30.0–36.0)
MCV: 84.9 fL (ref 80.0–100.0)
Platelets: 88 10*3/uL — ABNORMAL LOW (ref 150–400)
RBC: 2.58 MIL/uL — ABNORMAL LOW (ref 4.22–5.81)
RDW: 14.4 % (ref 11.5–15.5)
WBC: 7 10*3/uL (ref 4.0–10.5)
nRBC: 0 % (ref 0.0–0.2)

## 2019-01-10 LAB — GLUCOSE, CAPILLARY
Glucose-Capillary: 117 mg/dL — ABNORMAL HIGH (ref 70–99)
Glucose-Capillary: 135 mg/dL — ABNORMAL HIGH (ref 70–99)
Glucose-Capillary: 161 mg/dL — ABNORMAL HIGH (ref 70–99)
Glucose-Capillary: 127 mg/dL — ABNORMAL HIGH (ref 70–99)

## 2019-01-10 LAB — EHRLICHIA ANTIBODY PANEL
E chaffeensis (HGE) Ab, IgG: NEGATIVE
E chaffeensis (HGE) Ab, IgM: NEGATIVE
E. Chaffeensis (HME) IgM Titer: NEGATIVE
E.Chaffeensis (HME) IgG: NEGATIVE

## 2019-01-10 LAB — PHOSPHORUS: Phosphorus: 4.9 mg/dL — ABNORMAL HIGH (ref 2.5–4.6)

## 2019-01-10 NOTE — Progress Notes (Signed)
PT Cancellation Note  Patient Details Name: Frederick Kline MRN: [id number] DOB: [birth year]   Cancelled Treatment:    Reason Eval/Treat Not Completed: Other (comment)   Politely declining progressive amb this afternoon due to nausea;   Roney Marion, [place]  Acute Rehabilitation Services Pager [phone] Office [place] 01/10/2019, 3:30 PM

## 2019-01-10 NOTE — TOC Initial Note (Signed)
Transition of Care [facility]) - Initial/Assessment Note    Patient Details  Name: Frederick Kline MRN: [id number] Date of Birth: [birth year]  Transition of Care [facility]) CM/SW Contact:    Bartholomew Crews, RN Phone Number: [phone] 01/10/2019, 3:15 PM  Clinical Narrative:                 Spoke with patient at bedside. Patient states that he hopes to transition home tomorrow after hemodialysis. States that he typically has hemodialysis at [facility] TTS, and that his significant other provides transportation to and from hemodialysis. He says he is followed by [facility] and PCP is Dr. Marcello Moores and CSW is [place]. Reports that he has all needed DME. No HH services at this time. Gets medications filled through [place] mail order. States that either SO or brother will provide transportation home once he knows when he will be going home. No transition of care needs identified at this time.   Expected Discharge Plan: Home/Self Care Barriers to Discharge: Continued Medical Work up   Patient Goals and CMS Choice Patient states their goals for this hospitalization and ongoing recovery are:: go home   Choice offered to / list presented to : NA  Expected Discharge Plan and Services Expected Discharge Plan: Home/Self Care In-house Referral: NA Discharge Planning Services: CM Consult Post Acute Care Choice: NA Living arrangements for the past 2 months: Single Family Home                 DME Arranged: N/A DME Agency: NA       HH Arranged: NA HH Agency: NA        Prior Living Arrangements/Services Living arrangements for the past 2 months: Single Family Home Lives with:: Self, Significant Other Patient language and need for interpreter reviewed:: Yes Do you feel safe going back to the place where you live?: Yes      Need for Family Participation in Patient Care: Yes (Comment) Care giver support system in place?: Yes (comment) Current home services: DME Criminal Activity/Legal Involvement  Pertinent to Current Situation/Hospitalization: No - Comment as needed  Activities of Daily Living Home Assistive Devices/Equipment: Eyeglasses, Oxygen, Walker (specify type) ADL Screening (condition at time of admission) Patient's cognitive ability adequate to safely complete daily activities?: Yes Is the patient deaf or have difficulty hearing?: No Does the patient have difficulty seeing, even when wearing glasses/contacts?: No Does the patient have difficulty concentrating, remembering, or making decisions?: No Patient able to express need for assistance with ADLs?: Yes Does the patient have difficulty dressing or bathing?: No Independently performs ADLs?: Yes (appropriate for developmental age) Does the patient have difficulty walking or climbing stairs?: Yes Weakness of Legs: Both Weakness of Arms/Hands: None  Permission Sought/Granted                  Emotional Assessment Appearance:: Appears stated age Attitude/Demeanor/Rapport: Engaged Affect (typically observed): Accepting Orientation: : Oriented to Self, Oriented to  Time, Oriented to Place, Oriented to Situation Alcohol / Substance Use: Not Applicable Psych Involvement: No (comment)  Admission diagnosis:  LIVER FAILURE SHOCK BRADYCARDIA HYPOTENSION Patient Active Problem List   Diagnosis Date Noted  . Acute liver failure without hepatic coma   . LFTs abnormal 01/06/2019  . Hepatitis   . Shock ([place])   . ESRD (end stage renal disease) ([place])    PCP:  [facility]:   [place], [place]. [place] [address]  Phone: [phone] Fax: [phone]     Social Determinants of Health (SDOH) Interventions    Readmission Risk Interventions No flowsheet data found.

## 2019-01-10 NOTE — Progress Notes (Signed)
PROGRESS NOTE    Frederick Kline  TKZ:[id number] DOB: [birth year] DOA: 01/06/2019 PCP: No primary care provider on file.   Brief Narrative: Frederick Kline is a 73 y.o. male, undergoes care at the [place], [place] of DM 2 with peripheral neuropathy, gastroparesis, HTN, HLD, GERD, PAD status post recent angioplasty 5/20 at [facility] for acute limb threatening ischemia of right leg after which he was started on Coumadin, ESRD on TTS HD, COPD on as needed home oxygen, OSA on CPAP, chronic systolic CHF, chronic low back pain status post spinal stimulator, s/p appendectomy, cholecystectomy, EGD/colonoscopy 11/2017 with descending colon tubular adenoma with polypectomy, EGD: 11/2017 suggestive of moderate duodenitis, normal biopsy but otherwise unremarkable, initially hospitalized at the [place] due to recurrent episodes of nausea, vomiting and inability to tolerate p.o., noted to have acute hepatitis/acute transaminitis of unclear etiology.  He then developed shock and bradycardia and was transferred to [facility] ICU on 01/06/2019 under CCM service for further evaluation and management.   Assessment & Plan:   Active Problems:   LFTs abnormal   Hepatitis   Shock ([place])   ESRD (end stage renal disease) ([place])   Acute liver failure without hepatic coma   Shock Suspected distributive related to hepatic injury per chart review. Resolved with supportive care, including vasopressors. Resolved.  Symptomatic bradycardia Etiology thought to be secondary to CCB, BB vs metabolic derangements. Improved. Cardiology consulted and recommend no pacemaker.  Acute hepatitis Unknown etiology. Peak AST of 9300 and ALT of 3500 on 6/7. Currently resolving. Hepatitis panel, Ehrlichia panel negative. HSV pcr, CMV, alpha-1-antitrypsin pending. Ferritin significantly elevated which could indicate hemochromatosis etiology. -Iron/TIBC  Coagulopathy Treated with FFP and vitamin K. Likely secondary to liver injury.  Anemia Likely  chronic disease secondary to kidney disease. Normocytic. Currently stable.  Thrombocytopenia Likely related to acute hepatitis. Stable.  PAD S/p recent intervention at OSH on 5/20 and is currently only on aspirin therapy. Was on warfarin as an outpatient which has been held.  Elevated troponin Demand ischemia.  ESRD on HD Dialysis TTS per nephrology  Essential hypertension Patient was taken off of previous antihypertensives secondary to shock and bradycardia. -Continue to hold Coreg and Diltiazem  GERD -Continue Protonix  Hyperlipidemia -Hold statin secondary to acute hepatitis  Diabetes mellitus, type II -Continue SSI  COPD On albuterol as an outpatient. Stable currently. -Continue Brovana  OSA on CPAP -Continue CPAP qhs  Nausea/vomiting Resolved.   DVT prophylaxis: SCDs Code Status:   Code Status: Full Code Family Communication: None Disposition Plan: Discharge home pending CLIP   Consultants:   Nephrology  Cardiology  CCM  Procedures:   HD (TTS)  Antimicrobials:  Vancomycin (6/6)  Cefepime (6/6>>6/7)    Subjective: No concerns overnight.  Objective: Vitals:   01/09/19 2030 01/09/19 2117 01/10/19 0435 01/10/19 0915  BP:  (!) 147/65 (!) 153/70 (!) 166/70  Pulse:  73 77 77  Resp:  18 16 18   Temp:  98.2 F (36.8 C) 98.3 F (36.8 C) 99 F (37.2 C)  TempSrc:  Oral Oral Oral  SpO2: 94% 95% 91% 95%  Weight:   92.6 kg   Height:        Intake/Output Summary (Last 24 hours) at 01/10/2019 1036 Last data filed at 01/10/2019 0846 Gross per 24 hour  Intake 840 ml  Output 0 ml  Net 840 ml   Filed Weights   01/09/19 0308 01/09/19 0324 01/10/19 0435  Weight: 86.6 kg 86.3 kg 92.6 kg    Examination:  General  exam: Appears calm and comfortable Respiratory system: Clear to auscultation. Respiratory effort normal. Cardiovascular system: S1 & S2 heard, RRR. No murmurs, rubs, gallops or clicks. Gastrointestinal system: Abdomen is distended,  soft and nontender. No organomegaly or masses felt. Normal bowel sounds heard. Central nervous system: Alert and oriented. No focal neurological deficits. Extremities: No edema. No calf tenderness Skin: No cyanosis. No rashes Psychiatry: Judgement and insight appear normal. Mood & affect appropriate.     Data Reviewed: I have personally reviewed following labs and imaging studies  CBC: Recent Labs  Lab 01/06/19 1839 01/07/19 0454 01/08/19 0412 01/09/19 0615 01/10/19 0507  WBC 14.8* 12.8* 9.5 7.3 7.0  NEUTROABS 12.8*  --   --   --   --   HGB 8.2* 7.2* 7.0* 7.7* 7.3*  HCT 25.0* 22.1* 21.1* 22.9* 21.9*  MCV 87.1 87.4 86.5 84.5 84.9  PLT 167 123* 98* 90* 88*   Basic Metabolic Panel: Recent Labs  Lab 01/06/19 1839 01/07/19 0028 01/07/19 0454 01/08/19 0412 01/09/19 0615 01/10/19 0507  NA 134* 136 134*  134* 136 136 134*  K 6.6* 5.3* 5.4*  5.4* 4.1 3.4* 3.8  CL 95* 95* 96*  95* 96* 96* 95*  CO2 17* 20* 19*  21* 23 26 27   GLUCOSE 150* 246* 282*  281* 102* 99 123*  BUN 59* 64* 67*  67* 77* 32* 43*  CREATININE 10.04* 10.28* 10.34*  10.08* 11.50* 6.02* 7.36*  CALCIUM 7.9* 7.9* 7.7*  7.8* 7.9* 8.3* 8.4*  MG 2.1  --  2.0  --   --   --   PHOS 9.6*  --  9.2*  --   --  4.9*   GFR: Estimated Creatinine Clearance: 9.9 mL/min (A) (by C-G formula based on SCr of 7.36 mg/dL (H)). Liver Function Tests: Recent Labs  Lab 01/06/19 1839 01/07/19 0454 01/08/19 0412 01/09/19 0615 01/10/19 0507  AST 4,014* 9,355* 2,855* 879* 345*  ALT 2,081* 3,594* 2,406* 1,729* 1,213*  ALKPHOS 134* 144* 143* 147* 128*  BILITOT 1.6* 1.0 1.2 1.1 0.9  PROT 5.9* 5.3* 5.4* 5.5* 5.8*  ALBUMIN 2.6* 2.5* 2.5* 2.5* 2.5*   No results for input(s): LIPASE, AMYLASE in the last 168 hours. No results for input(s): AMMONIA in the last 168 hours. Coagulation Profile: Recent Labs  Lab 01/07/19 0822 01/07/19 1726 01/08/19 0412 01/08/19 1611 01/09/19 0615  INR 5.6* 3.0* 2.9* 2.6* 2.1*   Cardiac  Enzymes: Recent Labs  Lab 01/06/19 1839 01/07/19 0028 01/07/19 0454  TROPONINI 0.26* 0.30* 0.31*   BNP (last 3 results) No results for input(s): PROBNP in the last 8760 hours. HbA1C: No results for input(s): HGBA1C in the last 72 hours. CBG: Recent Labs  Lab 01/09/19 0705 01/09/19 1059 01/09/19 1635 01/09/19 2113 01/10/19 0700  GLUCAP 80 163* 167* 135* 117*   Lipid Profile: No results for input(s): CHOL, HDL, LDLCALC, TRIG, CHOLHDL, LDLDIRECT in the last 72 hours. Thyroid Function Tests: No results for input(s): TSH, T4TOTAL, FREET4, T3FREE, THYROIDAB in the last 72 hours. Anemia Panel: No results for input(s): VITAMINB12, FOLATE, FERRITIN, TIBC, IRON, RETICCTPCT in the last 72 hours. Sepsis Labs: Recent Labs  Lab 01/06/19 1839  PROCALCITON 7.39  LATICACIDVEN 6.6*    Recent Results (from the past 240 hour(s))  MRSA PCR Screening     Status: None   Collection Time: 01/06/19  5:33 PM  Result Value Ref Range Status   MRSA by PCR NEGATIVE NEGATIVE Final    Comment:  The GeneXpert MRSA Assay (FDA approved for NASAL specimens only), is one component of a comprehensive MRSA colonization surveillance program. It is not intended to diagnose MRSA infection nor to guide or monitor treatment for MRSA infections. Performed at [facility], [place] [address], [place], [place] [address]   Culture, blood (routine x 2)     Status: None (Preliminary result)   Collection Time: 01/06/19  6:34 PM  Result Value Ref Range Status   Specimen Description BLOOD RIGHT HAND  Final   Special Requests AEROBIC BOTTLE ONLY Blood Culture adequate volume  Final   Culture   Final    NO GROWTH 3 DAYS Performed at [facility], [place] [address] [place], [place] [address]    Report Status PENDING  Incomplete  Culture, blood (routine x 2)     Status: None (Preliminary result)   Collection Time: 01/06/19  6:53 PM  Result Value Ref Range Status   Specimen Description BLOOD RIGHT  HAND  Final   Special Requests   Final    AEROBIC BOTTLE ONLY Blood Culture results may not be optimal due to an inadequate volume of blood received in culture bottles   Culture   Final    NO GROWTH 3 DAYS Performed at [facility], [place] [address], [place], [place] [address]    Report Status PENDING  Incomplete         Radiology Studies: No results found.      Scheduled Meds: . arformoterol  15 mcg Nebulization BID  . aspirin  325 mg Oral Daily  . budesonide (PULMICORT) nebulizer solution  0.25 mg Nebulization BID  . insulin aspart  0-9 Units Subcutaneous TID WC  . ipratropium-albuterol  3 mL Nebulization BID  . loratadine  10 mg Oral Daily  . mirtazapine  7.5 mg Oral QHS  . pantoprazole  40 mg Oral Daily  . sevelamer carbonate  2,400 mg Oral TID WC   Continuous Infusions: . sodium chloride 10 mL/hr at 01/08/19 0600     LOS: 4 days     Cordelia Poche, MD [facility] 01/10/2019, 10:36 AM  If 7PM-7AM, please contact night-coverage [email or web address]

## 2019-01-10 NOTE — Progress Notes (Signed)
Patient refused bed alarm. Patient verbalized to call staff if he needs to go to the bathroom. Call bell within reach. Will continue to monitor. Thurston Brendlinger, Wonda Cheng, Therapist, sports

## 2019-01-10 NOTE — Progress Notes (Signed)
[facility] Progress Note    Assessment/ Plan:   73 y.o.malewith ESRD2/2 DM type 2(chronic HD TTS followed by [facility] unit) hoPAD with [facility] was [facility] to [facility] with5 days of N/V,generalized weakness, and lightheadedness. Workup is remarkable for elevated liver enzymes)Workup there revealed acute hepatitis of unclear origin.   Dialysis Orders: [facility] EDW:"185 pounds "HD Bath Time 4hrHeparin none. AccessLUABCF  Assessment/Plan 1. ESRD -Plan next HD Thursday 1st thing in AM. Plan per pt was he was to be d/c tomorrow. If that's the case we would have to HD 1st before d/c o/w he would miss a tx. 2. Shock,- per admit team =SvO2 argues against cardiogenic. Pressor requirements improved today with supportive care. 3. Elevated INR With liver injury - RX per Admit Use no hep hd ( was on Coumadin for PAD) 4. Acute liver injuryof unclear origin= wu per admit team  5. HO PAD recent R LE procedure / l foot ulcers. Lt 1st toe erythematous 6. Anemia - hgb 7.7 , May need transfusion on hd tomor / check with op kid center for ESA use 7. Metabolic bone disease -Fu ca/phos / ck op unit for hd vit d use No binders listed , thinks he was on Renvela as binder. Phos 9.2 Renvela 3 tabs TIDM Repeat phos in AM.  Subjective:   Fatigued more than anything else, mild dyspnea, no cough. No n/v. Ambulating comfortably.  Tolerated HD last time w/o cramping   Objective:   BP (!) 166/70 (BP Location: Left Arm)   Pulse 77   Temp 99 F (37.2 C) (Oral)   Resp 18   Ht 5\' 10"  (1.778 m)   Wt 92.6 kg   SpO2 95%   BMI 29.29 kg/m   Intake/Output Summary (Last 24 hours) at 01/10/2019 1327 Last data filed at 01/10/2019 0846 Gross per 24 hour  Intake 600 ml  Output 0 ml  Net 600 ml   Weight change: 6 kg  Physical Exam: GEN: NAD, A&Ox3, NCAT HEENT: No  conjunctival pallor, EOMI NECK: Supple, no thyromegaly LUNGS: CTA B/L no rales, rhonchi or wheezing CV: RRR, No M/R/G ABD: SNDNT +BS  EXT:Lt GT erythejmatous ACCESS: lt BCF + bruit  Imaging: No results found.  Labs: BMET Recent Labs  Lab 01/06/19 1839 01/07/19 0028 01/07/19 0454 01/08/19 0412 01/09/19 0615 01/10/19 0507  NA 134* 136 134*  134* 136 136 134*  K 6.6* 5.3* 5.4*  5.4* 4.1 3.4* 3.8  CL 95* 95* 96*  95* 96* 96* 95*  CO2 17* 20* 19*  21* 23 26 27   GLUCOSE 150* 246* 282*  281* 102* 99 123*  BUN 59* 64* 67*  67* 77* 32* 43*  CREATININE 10.04* 10.28* 10.34*  10.08* 11.50* 6.02* 7.36*  CALCIUM 7.9* 7.9* 7.7*  7.8* 7.9* 8.3* 8.4*  PHOS 9.6*  --  9.2*  --   --  4.9*   CBC Recent Labs  Lab 01/06/19 1839 01/07/19 0454 01/08/19 0412 01/09/19 0615 01/10/19 0507  WBC 14.8* 12.8* 9.5 7.3 7.0  NEUTROABS 12.8*  --   --   --   --   HGB 8.2* 7.2* 7.0* 7.7* 7.3*  HCT 25.0* 22.1* 21.1* 22.9* 21.9*  MCV 87.1 87.4 86.5 84.5 84.9  PLT 167 123* 98* 90* 88*    Medications:    . arformoterol  15 mcg Nebulization BID  . aspirin  325 mg Oral Daily  . budesonide (PULMICORT) nebulizer  solution  0.25 mg Nebulization BID  . insulin aspart  0-9 Units Subcutaneous TID WC  . ipratropium-albuterol  3 mL Nebulization BID  . loratadine  10 mg Oral Daily  . mirtazapine  7.5 mg Oral QHS  . pantoprazole  40 mg Oral Daily  . sevelamer carbonate  2,400 mg Oral TID WC      Otelia Santee, MD 01/10/2019, 1:27 PM

## 2019-01-11 ENCOUNTER — Encounter (HOSPITAL_COMMUNITY): Payer: Self-pay | Admitting: *Deleted

## 2019-01-11 LAB — COMPREHENSIVE METABOLIC PANEL
ALT: 871 U/L — ABNORMAL HIGH (ref 0–44)
AST: 170 U/L — ABNORMAL HIGH (ref 15–41)
Albumin: 2.6 g/dL — ABNORMAL LOW (ref 3.5–5.0)
Alkaline Phosphatase: 116 U/L (ref 38–126)
Anion gap: 15 (ref 5–15)
BUN: 55 mg/dL — ABNORMAL HIGH (ref 8–23)
CO2: 26 mmol/L (ref 22–32)
Calcium: 8.5 mg/dL — ABNORMAL LOW (ref 8.9–10.3)
Chloride: 92 mmol/L — ABNORMAL LOW (ref 98–111)
Creatinine, Ser: 9.38 mg/dL — ABNORMAL HIGH (ref 0.61–1.24)
GFR calc Af Amer: 6 mL/min — ABNORMAL LOW (ref >60)
GFR calc non Af Amer: 5 mL/min — ABNORMAL LOW (ref >60)
Glucose, Bld: 124 mg/dL — ABNORMAL HIGH (ref 70–99)
Potassium: 4.5 mmol/L (ref 3.5–5.1)
Sodium: 133 mmol/L — ABNORMAL LOW (ref 135–145)
Total Bilirubin: 1.2 mg/dL (ref 0.3–1.2)
Total Protein: 5.7 g/dL — ABNORMAL LOW (ref 6.5–8.1)

## 2019-01-11 LAB — CULTURE, BLOOD (ROUTINE X 2)
Culture: NO GROWTH
Culture: NO GROWTH
Special Requests: ADEQUATE

## 2019-01-11 LAB — CBC
HCT: 22.5 % — ABNORMAL LOW (ref 39.0–52.0)
Hemoglobin: 7.4 g/dL — ABNORMAL LOW (ref 13.0–17.0)
MCH: 28.1 pg (ref 26.0–34.0)
MCHC: 32.9 g/dL (ref 30.0–36.0)
MCV: 85.6 fL (ref 80.0–100.0)
Platelets: 109 10*3/uL — ABNORMAL LOW (ref 150–400)
RBC: 2.63 MIL/uL — ABNORMAL LOW (ref 4.22–5.81)
RDW: 14.6 % (ref 11.5–15.5)
WBC: 8.2 10*3/uL (ref 4.0–10.5)
nRBC: 0 % (ref 0.0–0.2)

## 2019-01-11 LAB — GLUCOSE, CAPILLARY
Glucose-Capillary: 114 mg/dL — ABNORMAL HIGH (ref 70–99)
Glucose-Capillary: 95 mg/dL (ref 70–99)

## 2019-01-11 LAB — IRON AND TIBC
Iron: 35 ug/dL — ABNORMAL LOW (ref 45–182)
Saturation Ratios: 18 % (ref 17.9–39.5)
TIBC: 192 ug/dL — ABNORMAL LOW (ref 250–450)
UIBC: 157 ug/dL

## 2019-01-11 LAB — ALPHA-1 ANTITRYPSIN PHENOTYPE: A-1 Antitrypsin, Ser: 167 mg/dL (ref 101–187)

## 2019-01-11 LAB — CMV DNA, QUANTITATIVE, PCR
CMV DNA Quant: POSITIVE IU/mL
Log10 CMV Qn DNA Pl: UNDETERMINED log10 IU/mL

## 2019-01-11 MED ORDER — AMLODIPINE BESYLATE 5 MG PO TABS: 5.0000 mg | ORAL_TABLET | Freq: Every day | ORAL | 0 refills | Status: AC

## 2019-01-11 MED ORDER — AMLODIPINE BESYLATE 5 MG PO TABS
5.0000 mg | ORAL_TABLET | Freq: Every day | ORAL | Status: DC
  Administered 2019-01-11: 5 mg via ORAL
  Filled 2019-01-11: qty 1

## 2019-01-11 MED ORDER — ASPIRIN 325 MG PO TABS: 325.0000 mg | ORAL_TABLET | Freq: Every day | ORAL | 0 refills | Status: AC

## 2019-01-11 NOTE — TOC Transition Note (Signed)
Transition of Care [facility]) - CM/SW Discharge Note   Patient Details  Name: Frederick Kline MRN: [id number] Date of Birth: 1946/06/06  Transition of Care [facility]) CM/SW Contact:  Bethena Roys, RN Phone Number: 01/11/2019, 3:48 PM   Clinical Narrative:  CM spoke with patient regarding [place]. Pt is currently active with [facility] in [place] for RN, PT, OT services. CM did reach out to MD to have orders placed in Epic. Referral sent to [facility] office and she will connect with [facility] to get orders to them. No further needs from CM at this time. Bethena Roys, RN, BSN Case Manager [phone]   Final next level of care: [place] Barriers to Discharge: No Barriers Identified   Patient Goals and CMS Choice Patient states their goals for this hospitalization and ongoing recovery are:: go home   Choice offered to / list presented to : (Pt was previously active with [facility]   Discharge Plan and Services In-house Referral: NA Discharge Planning Services: CM Consult Post Acute Care Choice: Home Health          DME Arranged: N/A DME Agency: NA       HH Arranged: RN, Disease Management, PT, OT HH Agency: [facility] ([facility] [employer]) Date [place]: 01/11/19 Time [place]: [place] Representative spoke with at [place]: [place]) Interventions     Readmission Risk Interventions No flowsheet data found.

## 2019-01-11 NOTE — Progress Notes (Signed)
PT Cancellation Note  Patient Details Name: Frederick Kline MRN: [id number] DOB: 1946/02/11   Cancelled Treatment:    Reason Eval/Treat Not Completed: Patient at procedure or test/unavailable. Pt in HD. PT to re-attempt as time allows.   Lorriane Shire 01/11/2019, 8:48 AM   Lorrin Goodell, PT  Office # [phone] Pager [phone]

## 2019-01-11 NOTE — Discharge Summary (Signed)
Physician Discharge Summary  Monique Gift OTL:[id number] DOB: September 18, 1945 DOA: 01/06/2019  [facility]  Admit date: 01/06/2019 Discharge date: 01/11/2019  Admitted From: Home Disposition: Home  Recommendations for Outpatient Follow-up:  1. Follow up with PCP in 1 week 2. Please obtain BMP/CBC in one week 3. Please follow up on the following pending results:  HSV pcr, alpha-1-antitrypsin  Home Health: PT, OT Equipment/Devices: None  Discharge Condition: Stable CODE STATUS: Full code Diet recommendation: Heart healthy/renal   Brief/Interim Summary:  Admission HPI written by Brand Males, MD   Brief History  73 year old man with multitude of medical problems detailed below presenting with 5 days of N/V to [facility] medical center.  Workup there revealed acute hepatitis of unclear origin.  Hospital day 2 noted in HD to be bradycardic and hypotensive.  Given multiple liter saline boluses without response.  Central line placed and called for transfer to [facility] as [facility] was full.  Currently c/o weakness, inability to tolerate of PO TSH 4, A1c 7.8%, CBC looked okay Albumin 2.8, AST 948 (from 1177), ALT 726 (from 630), Alk phos 150, bili 0.5, K 5.1, Mg 2.3    Hospital course:  Shock Suspected distributive related to hepatic injury per chart review. Resolved with supportive care, including vasopressors. Resolved.  Symptomatic bradycardia Etiology thought to be secondary to CCB, BB vs metabolic derangements. Improved. Cardiology consulted and recommend no pacemaker.  Acute hepatitis Unknown etiology, however, CMV pcr is positive. IGG/IGM not obtained. Possible this could have contributed to presentation. Peak AST of 9300 and ALT of 3500 on 6/7. Currently resolving. Hepatitis panel, Ehrlichia panel negative. HSV pcr, alpha-1-antitrypsin pending. Ferritin significantly elevated which could indicate hemochromatosis etiology. Iron saturation normal. Will need  close outpatient follow-up.  Coagulopathy Treated with FFP and vitamin K. Likely secondary to liver injury.  Anemia Likely chronic disease secondary to kidney disease. Normocytic. Currently stable.  Thrombocytopenia Likely related to acute hepatitis. Stable.  PAD S/p recent intervention at OSH on 5/20 and is currently only on aspirin therapy. Was on warfarin as an outpatient which has been held.  Elevated troponin Demand ischemia.  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 Elevated blood pressure so started amlodipine. Outpatient follow-up.  GERD Continue Protonix  Hyperlipidemia Not on statin therapy. Recommend holding in light of recent liver injury.  Diabetes mellitus, type II SSI while inpatient. Continue outpatient regimen on discharge.  COPD On albuterol as an outpatient. Stable currently.  OSA on CPAP Continued CPAP qhs  Nausea/vomiting Resolved.  Discharge Diagnoses:  Active Problems:   LFTs abnormal   Hepatitis   Shock ([place])   ESRD (end stage renal disease) ([place])   Acute liver failure without hepatic coma    Discharge Instructions  Discharge Instructions    Call MD for:  persistant nausea and vomiting   Complete by: As directed    Call MD for:  severe uncontrolled pain   Complete by: As directed    Call MD for:  temperature >100.4   Complete by: As directed    Increase activity slowly   Complete by: As directed      Allergies as of 01/11/2019      Reactions   Amoxicillin    Morphine And Related       Medication List    STOP taking these medications   carvedilol 12.5 MG tablet Commonly known as: COREG   diltiazem  360 MG 24 hr capsule Commonly known as: TIAZAC   gabapentin 100 MG capsule Commonly known as: NEURONTIN   warfarin 5 MG tablet Commonly known as: COUMADIN     TAKE these medications   albuterol 108 (90 Base)  MCG/ACT inhaler Commonly known as: VENTOLIN HFA Inhale 2 puffs into the lungs every 6 (six) hours as needed for wheezing or shortness of breath.   amLODipine 5 MG tablet Commonly known as: NORVASC Take 1 tablet (5 mg total) by mouth daily. Start taking on: January 12, 2019   aspirin 325 MG tablet Take 1 tablet (325 mg total) by mouth daily. Start taking on: January 12, 2019   budesonide-formoterol 160-4.5 MCG/ACT inhaler Commonly known as: SYMBICORT Inhale 2 puffs into the lungs 2 (two) times daily.   calcium acetate 667 MG capsule Commonly known as: PHOSLO Take 1,334-2,001 mg by mouth See admin instructions. Take 3 capsules by mouth 3 times a day with meals and 2 capsule twice a day with snacks   cetirizine 10 MG tablet Commonly known as: ZYRTEC Take 10 mg by mouth daily.   diclofenac sodium 1 % Gel Commonly known as: VOLTAREN Apply 4 g topically 3 (three) times daily as needed (to affected knee).   docusate sodium 100 MG capsule Commonly known as: COLACE Take 300 mg by mouth daily.   eucerin cream Apply 1 application topically 2 (two) times daily as needed for dry skin.   ferric citrate 1 GM 210 MG(Fe) tablet Commonly known as: AURYXIA Take 210 mg by mouth See admin instructions. Take 1 tablet by mouth with meals and snacks   lanthanum 1000 MG chewable tablet Commonly known as: FOSRENOL Chew 1,000 mg by mouth See admin instructions. Take 1 tablet by mouth 3 times a day with meals and 1 tablet twice a day with snacks   mirtazapine 15 MG tablet Commonly known as: REMERON Take 7.5-15 mg by mouth See admin instructions. Take 1/2 tablet by mouth at bedtime for 7 days, then 1 tablet at bedtime   mupirocin ointment 2 % Commonly known as: BACTROBAN Place 1 application into the nose 2 (two) times daily.   omeprazole 20 MG capsule Commonly known as: PRILOSEC Take 20 mg by mouth daily.   ondansetron 8 MG tablet Commonly known as: ZOFRAN Take 4 mg by mouth 3 (three) times  daily as needed for nausea or vomiting.   pregabalin 25 MG capsule Commonly known as: LYRICA Take 25 mg by mouth 2 (two) times daily.   promethazine 25 MG tablet Commonly known as: PHENERGAN Take 25 mg by mouth 4 (four) times daily as needed for nausea or vomiting.   senna 8.6 MG Tabs tablet Commonly known as: SENOKOT Take 2 tablets by mouth daily as needed for mild constipation.   sevelamer carbonate 800 MG tablet Commonly known as: RENVELA Take 800-2,400 mg by mouth See admin instructions. Take 3 tablet by mouth 3 times a day with meals and 1 tablet 3 times daily with snacks   sodium chloride 5 % ophthalmic solution Commonly known as: MURO 128 Place 1 drop into the right eye 4 (four) times daily.   Tiotropium Bromide Monohydrate 2.5 MCG/ACT Aers Inhale 2 puffs into the lungs daily.   torsemide 20 MG tablet Commonly known as: DEMADEX Take 40 mg by mouth daily. Non-dialysis days only   traMADol 50 MG tablet Commonly known as: ULTRAM Take 25 mg by mouth 2 (two) times daily.      Follow-up Information  [place] Schedule an appointment as soon as possible for a visit in 1 week(s).   Specialty: General Practice Contact information: [place] [address] [phone]        [facility] Follow up.   Specialty: Home Health Services Why: Alden Server will service the patient for Home Health Registered Nurse, Physical and Occupational Therapy- Contact information: [place] [address] [phone]          Allergies  Allergen Reactions   Amoxicillin    Morphine And Related     Consultations:  Nephrology  Cardiology  CCM   Procedures/Studies: US Abdomen Complete  Result Date: 01/07/2019 CLINICAL DATA:  Patient with liver failure.  Prior cholecystectomy. EXAM: ABDOMEN ULTRASOUND COMPLETE COMPARISON:  None. FINDINGS: Gallbladder: Surgically absent Common bile duct: Diameter: 6 mm Liver:  Nodular in contour. No focal lesion identified. Portal vein is patent on color Doppler imaging with normal direction of blood flow towards the liver. IVC: No abnormality visualized. Pancreas: Visualized portion unremarkable. Spleen: Size and appearance within normal limits. Right Kidney: Length: 9.1 cm. Renal cortical thinning. No hydronephrosis. Mild increased renal cortical echogenicity. Left Kidney: Length: 9.3 cm. Renal cortical thinning. No hydronephrosis. Mild increased renal cortical echogenicity. Abdominal aorta: No aneurysm visualized. Other findings: Small volume ascites.  Right pleural effusion. IMPRESSION: 1. Morphologic changes to the liver raising the possibility of cirrhosis. 2. Small volume ascites. 3. Right pleural effusion. 4. Mildly atrophic kidneys bilaterally with increased cortical echogenicity as can be seen with chronic medical renal disease. Electronically Signed   By: Lovey Newcomer M.D.   On: 01/07/2019 11:00   Dg Chest Port 1 View  Result Date: 01/06/2019 CLINICAL DATA:  Shortness of breath, liver injury, sepsis EXAM: PORTABLE CHEST 1 VIEW COMPARISON:  None. FINDINGS: Cardiomegaly. Right neck vascular catheter, tip projecting over the mid SVC. There is a small left pleural effusion with associated atelectasis or consolidation. There may be a trace right pleural effusion. IMPRESSION: 1. There is a small left pleural effusion with associated atelectasis or consolidation. There may be a trace right pleural effusion. 2.  Cardiomegaly. Electronically Signed   By: Eddie Candle M.D.   On: 01/06/2019 18:45      Subjective: No issues today. No abdominal pain.  Discharge Exam: Vitals:   01/11/19 1205 01/11/19 1238  BP: (!) 170/80 (!) 171/75  Pulse: 84 80  Resp: 18 18  Temp: 98.4 F (36.9 C) 98.7 F (37.1 C)  SpO2: 100% 96%   Vitals:   01/11/19 1100 01/11/19 1130 01/11/19 1205 01/11/19 1238  BP: (!) 160/72 (!) 160/70 (!) 170/80 (!) 171/75  Pulse: 75 75 84 80  Resp: '18 18 18 18   ' Temp:   98.4 F (36.9 C) 98.7 F (37.1 C)  TempSrc:   Oral Oral  SpO2: 100% 100% 100% 96%  Weight:   87.5 kg   Height:        General: Pt is alert, awake, not in acute distress Cardiovascular: RRR, S1/S2 +, no rubs, no gallops Respiratory: CTA bilaterally, no wheezing, no rhonchi Abdominal: Soft, NT, ND, bowel sounds + Extremities: no edema, no cyanosis    The results of significant diagnostics from this hospitalization (including imaging, microbiology, ancillary and laboratory) are listed below for reference.     Microbiology: Recent Results (from the past 240 hour(s))  MRSA PCR Screening     Status: None   Collection Time: 01/06/19  5:33 PM   Specimen: Nasal  Mucosa; Nasopharyngeal  Result Value Ref Range Status   MRSA by PCR NEGATIVE NEGATIVE Final    Comment:        The GeneXpert MRSA Assay (FDA approved for NASAL specimens only), is one component of a comprehensive MRSA colonization surveillance program. It is not intended to diagnose MRSA infection nor to guide or monitor treatment for MRSA infections. Performed at [facility], [place] [address], [place], [place] [address]   Culture, blood (routine x 2)     Status: None   Collection Time: 01/06/19  6:34 PM   Specimen: BLOOD RIGHT HAND  Result Value Ref Range Status   Specimen Description BLOOD RIGHT HAND  Final   Special Requests AEROBIC BOTTLE ONLY Blood Culture adequate volume  Final   Culture   Final    NO GROWTH 5 DAYS Performed at [facility], [place] [address], [place], [place] [address]    Report Status 01/11/2019 FINAL  Final  Culture, blood (routine x 2)     Status: None   Collection Time: 01/06/19  6:53 PM   Specimen: BLOOD RIGHT HAND  Result Value Ref Range Status   Specimen Description BLOOD RIGHT HAND  Final   Special Requests   Final    AEROBIC BOTTLE ONLY Blood Culture results may not be optimal due to an inadequate volume of blood received in culture bottles   Culture   Final     NO GROWTH 5 DAYS Performed at [facility], [place] [address], [place], [place] [address]    Report Status 01/11/2019 FINAL  Final     Labs: BNP (last 3 results) Recent Labs    01/06/19 1839  BNP 6,010.9*   Basic Metabolic Panel: Recent Labs  Lab 01/06/19 1839  01/07/19 0454 01/08/19 0412 01/09/19 0615 01/10/19 0507 01/11/19 0528  NA 134*   < > 134*   134* 136 136 134* 133*  K 6.6*   < > 5.4*   5.4* 4.1 3.4* 3.8 4.5  CL 95*   < > 96*   95* 96* 96* 95* 92*  CO2 17*   < > 19*   21* '23 26 27 26  ' GLUCOSE 150*   < > 282*   281* 102* 99 123* 124*  BUN 59*   < > 67*   67* 77* 32* 43* 55*  CREATININE 10.04*   < > 10.34*   10.08* 11.50* 6.02* 7.36* 9.38*  CALCIUM 7.9*   < > 7.7*   7.8* 7.9* 8.3* 8.4* 8.5*  MG 2.1  --  2.0  --   --   --   --   PHOS 9.6*  --  9.2*  --   --  4.9*  --    < > = values in this interval not displayed.   Liver Function Tests: Recent Labs  Lab 01/07/19 0454 01/08/19 0412 01/09/19 0615 01/10/19 0507 01/11/19 0528  AST 9,355* 2,855* 879* 345* 170*  ALT 3,594* 2,406* 1,729* 1,213* 871*  ALKPHOS 144* 143* 147* 128* 116  BILITOT 1.0 1.2 1.1 0.9 1.2  PROT 5.3* 5.4* 5.5* 5.8* 5.7*  ALBUMIN 2.5* 2.5* 2.5* 2.5* 2.6*   No results for input(s): LIPASE, AMYLASE in the last 168 hours. No results for input(s): AMMONIA in the last 168 hours. CBC: Recent Labs  Lab 01/06/19 1839 01/07/19 0454 01/08/19 0412 01/09/19 0615 01/10/19 0507 01/11/19 0528  WBC 14.8* 12.8* 9.5 7.3 7.0 8.2  NEUTROABS 12.8*  --   --   --   --   --  HGB 8.2* 7.2* 7.0* 7.7* 7.3* 7.4*  HCT 25.0* 22.1* 21.1* 22.9* 21.9* 22.5*  MCV 87.1 87.4 86.5 84.5 84.9 85.6  PLT 167 123* 98* 90* 88* 109*   Cardiac Enzymes: Recent Labs  Lab 01/06/19 1839 01/07/19 0028 01/07/19 0454  TROPONINI 0.26* 0.30* 0.31*   BNP: Invalid input(s): POCBNP CBG: Recent Labs  Lab 01/10/19 1132 01/10/19 1622 01/10/19 2101 01/11/19 0649 01/11/19 1236  GLUCAP 135* 161* 127* 114* 95   Anemia  work up Recent Labs    01/11/19 0528  TIBC 192*  IRON 35*   Microbiology Recent Results (from the past 240 hour(s))  MRSA PCR Screening     Status: None   Collection Time: 01/06/19  5:33 PM   Specimen: Nasal Mucosa; Nasopharyngeal  Result Value Ref Range Status   MRSA by PCR NEGATIVE NEGATIVE Final    Comment:        The GeneXpert MRSA Assay (FDA approved for NASAL specimens only), is one component of a comprehensive MRSA colonization surveillance program. It is not intended to diagnose MRSA infection nor to guide or monitor treatment for MRSA infections. Performed at [facility], [place] [address], [place], [place] [address]   Culture, blood (routine x 2)     Status: None   Collection Time: 01/06/19  6:34 PM   Specimen: BLOOD RIGHT HAND  Result Value Ref Range Status   Specimen Description BLOOD RIGHT HAND  Final   Special Requests AEROBIC BOTTLE ONLY Blood Culture adequate volume  Final   Culture   Final    NO GROWTH 5 DAYS Performed at [facility], [place] [address], [place], [place] [address]    Report Status 01/11/2019 FINAL  Final  Culture, blood (routine x 2)     Status: None   Collection Time: 01/06/19  6:53 PM   Specimen: BLOOD RIGHT HAND  Result Value Ref Range Status   Specimen Description BLOOD RIGHT HAND  Final   Special Requests   Final    AEROBIC BOTTLE ONLY Blood Culture results may not be optimal due to an inadequate volume of blood received in culture bottles   Culture   Final    NO GROWTH 5 DAYS Performed at [facility], [place] [address], [place], [place] [address]    Report Status 01/11/2019 FINAL  Final     SIGNED:   Cordelia Poche, MD [facility] 01/11/2019, 3:47 PM

## 2019-01-11 NOTE — Progress Notes (Signed)
DISCHARGE NOTE HOME Bridger Pizzi to be discharged Home per MD order. Discussed prescriptions and follow up appointments with the patient. Prescriptions given to patient; medication list explained in detail. Patient verbalized understanding.  Skin clean, dry and intact without evidence of skin break down, no evidence of skin tears noted. IV catheter discontinued intact. Site without signs and symptoms of complications. Dressing and pressure applied. Pt denies pain at the site currently. No complaints noted.  Patient free of lines, drains, and wounds.   An After Visit Summary (AVS) was printed and given to the patient. Patient escorted via wheelchair, and discharged home via private auto.  Arlyss Repress, RN

## 2019-01-11 NOTE — Progress Notes (Signed)
[facility] Progress Note    Assessment/ Plan:   73 y.o.malewith ESRD2/2 DM type 2(chronic HD TTS [facility]) hoPAD [facility] was initially admitted to [facility] with5 days of N/V,generalized weakness, and lightheadedness. Workup is remarkable for elevated liver enzymes)Workup there revealed acute hepatitis of unclear origin.   [facility]: [facility] EDW:"185 pounds "HD Bath Time 4hrHeparin none. AccessLUABCF   Assessment/Plan 1. ESRD -Plan per pt was he was to be d/c today.  Seen on HD;  Lt BCF  3/2.5 bath 2.5L UF (net 2L) 157/72 No complaints; no changes  2. Shock,- per admit team =SvO2 argues against cardiogenic. Resolved.  3. Elevated INR With liver injury - RX per Admit Use no hep hd ( was on Coumadin for PAD) 4. Acute liver injuryof unclear origin= wu per admit team  5. HO PAD recent R LE procedure / l foot ulcers. Lt 1st toe erythematous 6. Anemia - hgb 7.7, May need transfusion on hd tomor / check with op kid center for ESA use 7. Metabolic bone disease -Fu ca/phos / ck op unit for hd vit d use No binders listed , thinks he was on Renvela as binder. Phos 9.2 Renvela 3 tabs TIDM Repeat phos in AM  Subjective:   Fatigued more than anything else, mild dyspnea, no cough. No n/v.  Ambulating comfortably.    Objective:   BP (!) 159/70   Pulse 78   Temp 98.6 F (37 C) (Oral)   Resp 14   Ht 5\' 10"  (1.778 m)   Wt 89.5 kg   SpO2 98%   BMI 28.31 kg/m   Intake/Output Summary (Last 24 hours) at 01/11/2019 0938 Last data filed at 01/11/2019 0900 Gross per 24 hour  Intake 940 ml  Output 0 ml  Net 940 ml   Weight change: -1 kg  Physical Exam: GEN: NAD, A&Ox3, NCAT HEENT: No conjunctival pallor, EOMI NECK: Supple, no thyromegaly LUNGS: CTA B/L no rales, rhonchi or wheezing CV: RRR, No M/R/G ABD: SNDNT +BS   EXT:Lt GT erythejmatous ACCESS: lt BCF + bruit  Imaging: No results found.  Labs: BMET Recent Labs  Lab 01/06/19 1839 01/07/19 0028 01/07/19 0454 01/08/19 0412 01/09/19 0615 01/10/19 0507 01/11/19 0528  NA 134* 136 134*  134* 136 136 134* 133*  K 6.6* 5.3* 5.4*  5.4* 4.1 3.4* 3.8 4.5  CL 95* 95* 96*  95* 96* 96* 95* 92*  CO2 17* 20* 19*  21* 23 26 27 26   GLUCOSE 150* 246* 282*  281* 102* 99 123* 124*  BUN 59* 64* 67*  67* 77* 32* 43* 55*  CREATININE 10.04* 10.28* 10.34*  10.08* 11.50* 6.02* 7.36* 9.38*  CALCIUM 7.9* 7.9* 7.7*  7.8* 7.9* 8.3* 8.4* 8.5*  PHOS 9.6*  --  9.2*  --   --  4.9*  --    CBC Recent Labs  Lab 01/06/19 1839  01/08/19 0412 01/09/19 0615 01/10/19 0507 01/11/19 0528  WBC 14.8*   < > 9.5 7.3 7.0 8.2  NEUTROABS 12.8*  --   --   --   --   --   HGB 8.2*   < > 7.0* 7.7* 7.3* 7.4*  HCT 25.0*   < > 21.1* 22.9* 21.9* 22.5*  MCV 87.1   < > 86.5 84.5 84.9 85.6  PLT 167   < > 98* 90* 88* 109*   < > = values in this interval not  displayed.    Medications:    . arformoterol  15 mcg Nebulization BID  . aspirin  325 mg Oral Daily  . budesonide (PULMICORT) nebulizer solution  0.25 mg Nebulization BID  . insulin aspart  0-9 Units Subcutaneous TID WC  . ipratropium-albuterol  3 mL Nebulization BID  . loratadine  10 mg Oral Daily  . mirtazapine  7.5 mg Oral QHS  . pantoprazole  40 mg Oral Daily  . sevelamer carbonate  2,400 mg Oral TID WC      Otelia Santee, MD 01/11/2019, 9:38 AM

## 2019-01-11 NOTE — Discharge Instructions (Signed)
Frederick Kline,  You were in the hospital because of low blood pressure which may have caused some liver damage or your liver damage may have caused your low blood pressure. Unfortunately, this is not clear. You were managed with fluids and some of your blood pressure medication was discontinued. Please follow-up with your primary care physician.

## 2019-01-11 NOTE — Progress Notes (Signed)
Patient at procedure or test/unavailable. Pt in HD. OT will check on pt later in day or next day.  Kari Baars, OT Acute Rehabilitation Services [phone] [phone]

## 2019-01-12 LAB — HSV DNA BY PCR (REFERENCE LAB)
HSV 1 DNA: NEGATIVE
HSV 2 DNA: NEGATIVE

## 2019-11-01 DEATH — deceased

## 2020-01-27 IMAGING — US ULTRASOUND ABDOMEN COMPLETE
1 series · 14 of 25 positions shown · non-contrast
Comparison: None.

CLINICAL DATA: Patient with liver failure.  Prior cholecystectomy.

EXAM:
ABDOMEN ULTRASOUND COMPLETE

[Series 1: ultrasound abdomen complete · 14 of 79 slices shown]
[im 1/79]
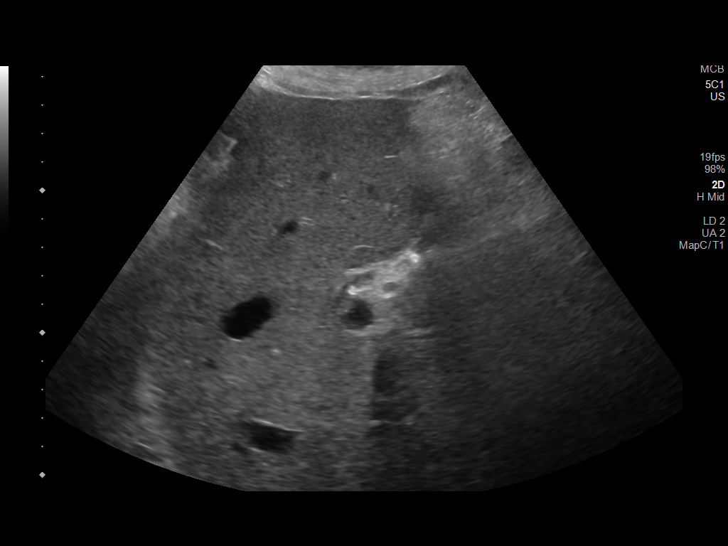
[im 7/79]
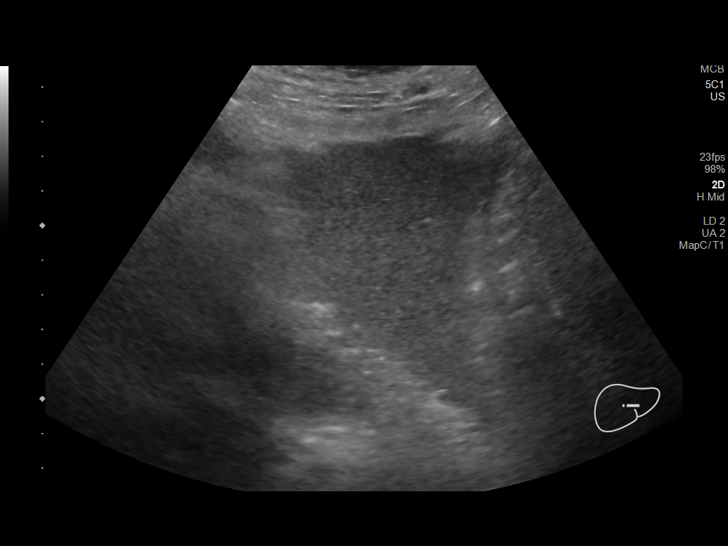
[im 14/79]
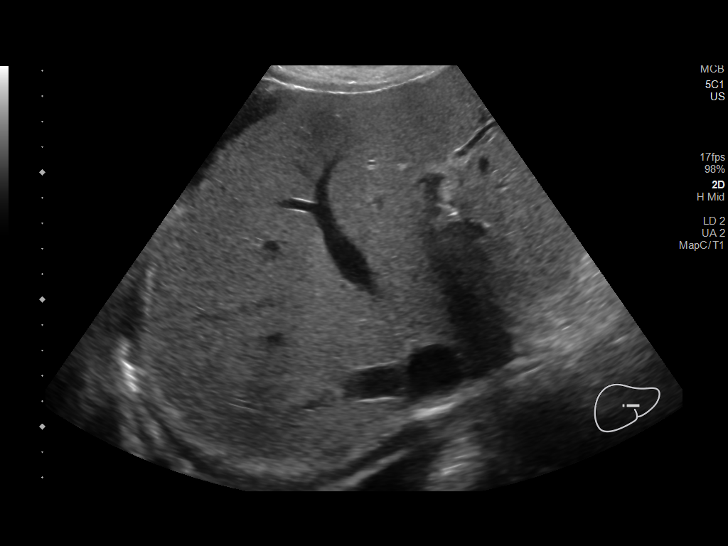
[im 20/79]
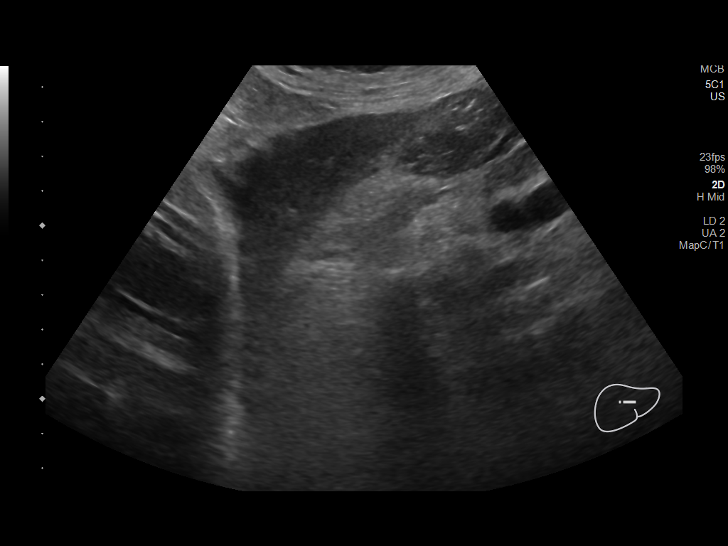
[im 27/79]
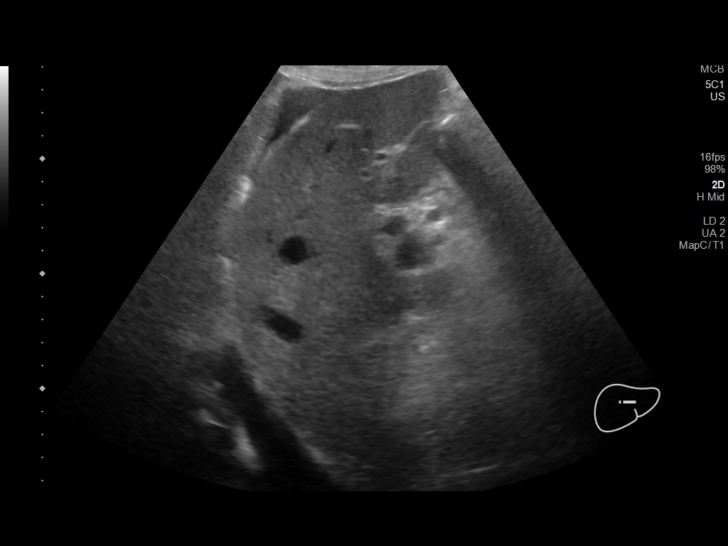
[im 30/79]
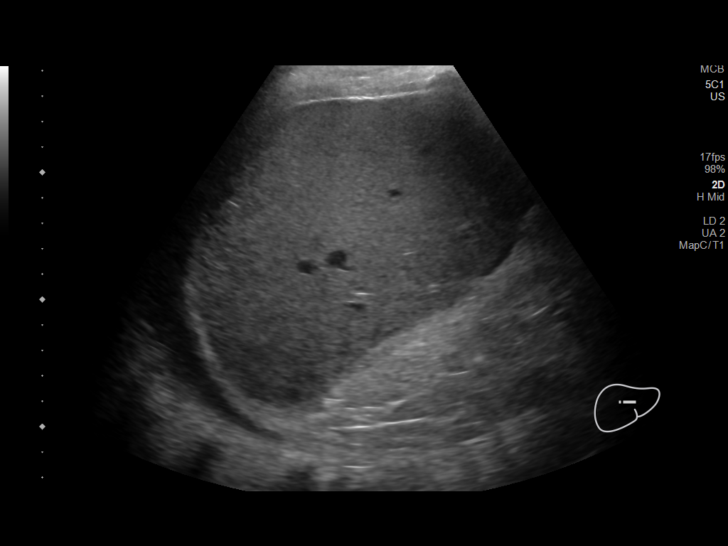
[im 36/79]
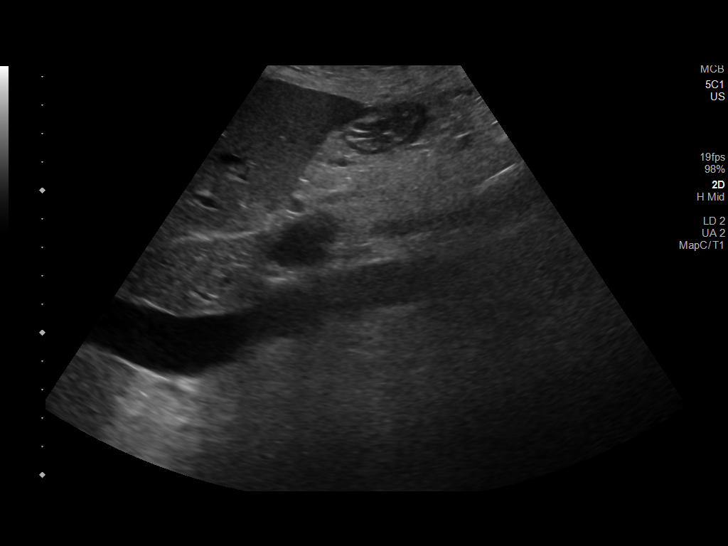
[im 43/79]
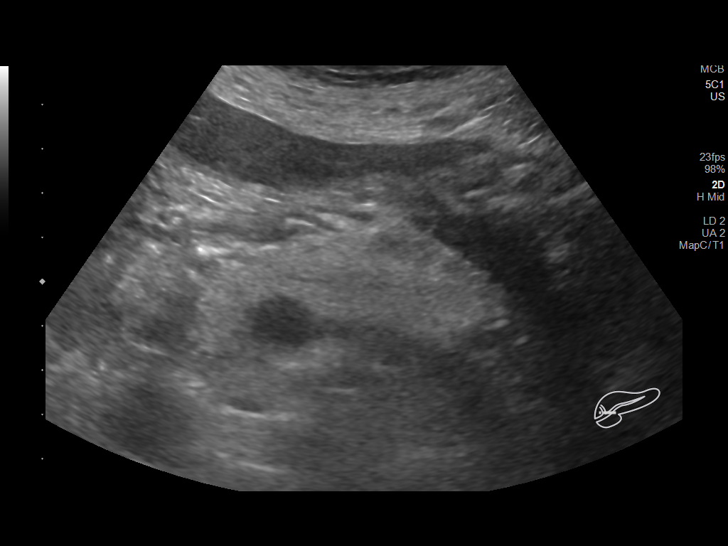
[im 49/79]
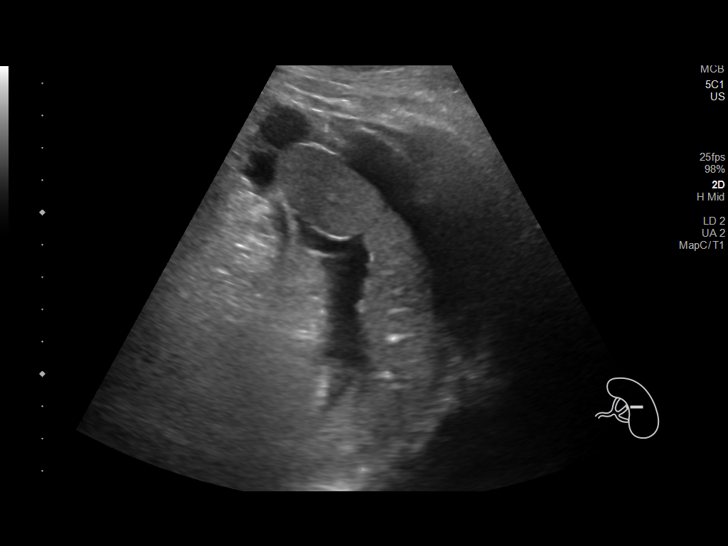
[im 53/79]
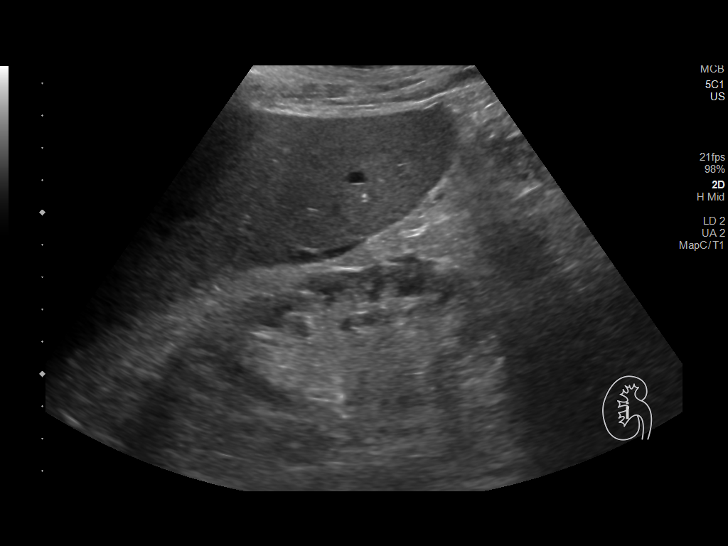
[im 59/79]
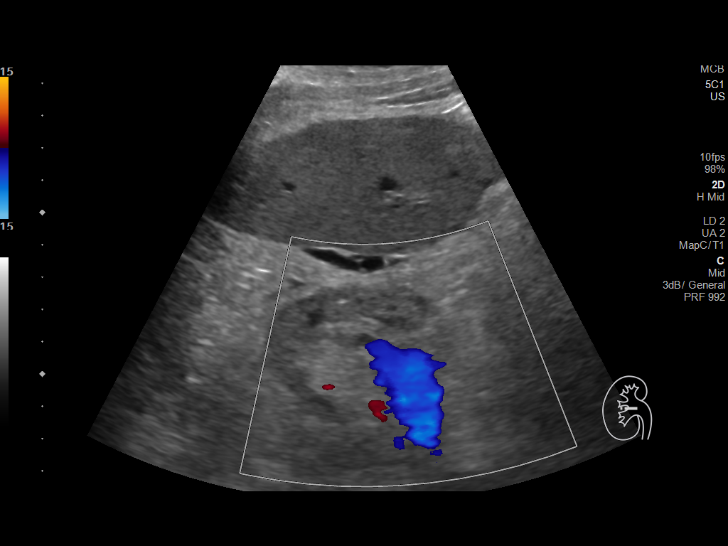
[im 66/79]
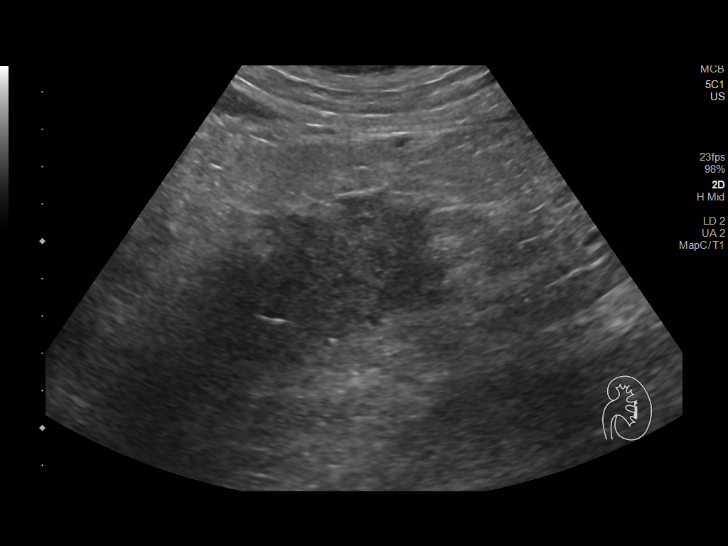
[im 72/79]
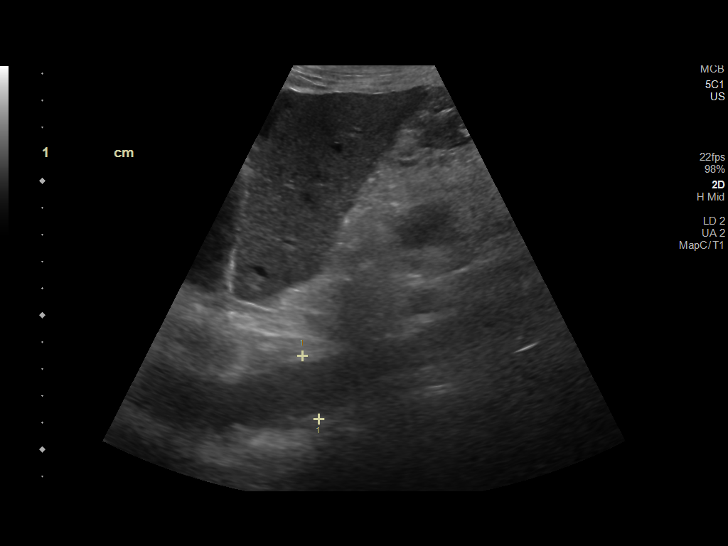
[im 79/79]
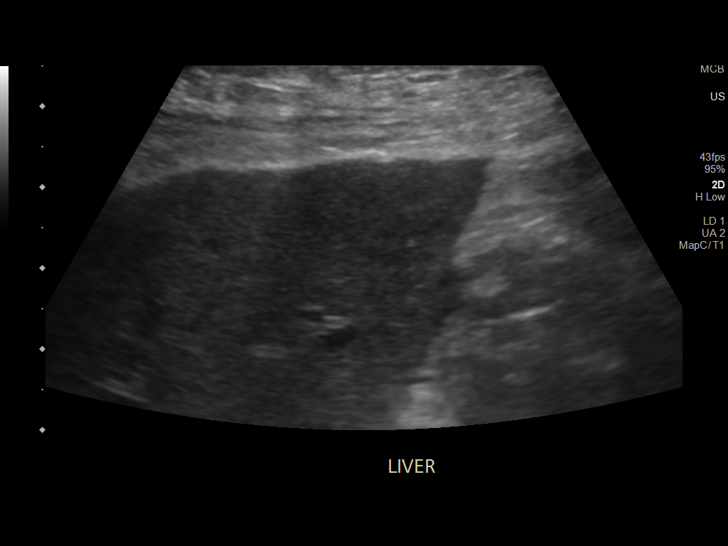

[14 of 25 positions shown; findings below may reference images not displayed]

FINDINGS: Gallbladder: Surgically absent

Common bile duct: Diameter: 6 mm

Liver: Nodular in contour. No focal lesion identified. Portal vein
is patent on color Doppler imaging with normal direction of blood
flow towards the liver.

IVC: No abnormality visualized.

Pancreas: Visualized portion unremarkable.

Spleen: Size and appearance within normal limits.

Right Kidney: Length: 9.1 cm. Renal cortical thinning. No
hydronephrosis. Mild increased renal cortical echogenicity.

Left Kidney: Length: 9.3 cm. Renal cortical thinning. No
hydronephrosis. Mild increased renal cortical echogenicity.

Abdominal aorta: No aneurysm visualized.

Other findings: Small volume ascites.  Right pleural effusion.
IMPRESSION: 1. Morphologic changes to the liver raising the possibility of
cirrhosis.
2. Small volume ascites.
3. Right pleural effusion.
4. Mildly atrophic kidneys bilaterally with increased cortical
echogenicity as can be seen with chronic medical renal disease.
